# Patient Record
Sex: Male | Born: 1955
Health system: Southern US, Community
[De-identification: ages and names within clinical notes are randomized; demographics above are authoritative.]

## PROBLEM LIST (undated history)

## (undated) DIAGNOSIS — M719 Bursopathy, unspecified: Secondary | ICD-10-CM

## (undated) DIAGNOSIS — E039 Hypothyroidism, unspecified: Secondary | ICD-10-CM

## (undated) DIAGNOSIS — Z9989 Dependence on other enabling machines and devices: Secondary | ICD-10-CM

## (undated) DIAGNOSIS — N189 Chronic kidney disease, unspecified: Secondary | ICD-10-CM

## (undated) DIAGNOSIS — E669 Obesity, unspecified: Secondary | ICD-10-CM

## (undated) DIAGNOSIS — G4733 Obstructive sleep apnea (adult) (pediatric): Secondary | ICD-10-CM

## (undated) DIAGNOSIS — R972 Elevated prostate specific antigen [PSA]: Secondary | ICD-10-CM

## (undated) DIAGNOSIS — R7301 Impaired fasting glucose: Secondary | ICD-10-CM

## (undated) HISTORY — PX: ESOPHAGUS SURGERY: SHX626

## (undated) HISTORY — DX: Dependence on other enabling machines and devices: Z99.89

## (undated) HISTORY — DX: Obstructive sleep apnea (adult) (pediatric): G47.33

## (undated) HISTORY — DX: Chronic kidney disease, unspecified: N18.9

## (undated) HISTORY — DX: Obesity, unspecified: E66.9

## (undated) HISTORY — DX: Elevated prostate specific antigen (PSA): R97.20

## (undated) HISTORY — DX: Hypothyroidism, unspecified: E03.9

## (undated) HISTORY — DX: Impaired fasting glucose: R73.01

---

## 2007-06-01 ENCOUNTER — Ambulatory Visit: Payer: Self-pay | Admitting: Family Medicine

## 2007-06-01 DIAGNOSIS — E669 Obesity, unspecified: Secondary | ICD-10-CM | POA: Insufficient documentation

## 2007-06-01 DIAGNOSIS — E039 Hypothyroidism, unspecified: Secondary | ICD-10-CM | POA: Insufficient documentation

## 2007-06-01 DIAGNOSIS — N200 Calculus of kidney: Secondary | ICD-10-CM | POA: Insufficient documentation

## 2007-07-17 ENCOUNTER — Encounter: Payer: Self-pay | Admitting: Family Medicine

## 2007-07-17 LAB — CONVERTED CEMR LAB
ALT: 61 units/L — ABNORMAL HIGH (ref 0–53)
AST: 47 units/L — ABNORMAL HIGH (ref 0–37)
Albumin: 4.3 g/dL (ref 3.5–5.2)
Alkaline Phosphatase: 91 units/L (ref 39–117)
BUN: 14 mg/dL (ref 6–23)
CO2: 24 meq/L (ref 19–32)
Calcium: 9.9 mg/dL (ref 8.4–10.5)
Chloride: 107 meq/L (ref 96–112)
Cholesterol: 145 mg/dL (ref 0–200)
Creatinine, Ser: 0.95 mg/dL (ref 0.40–1.50)
Glucose, Bld: 125 mg/dL — ABNORMAL HIGH (ref 70–99)
HDL: 44 mg/dL (ref >39)
LDL Cholesterol: 61 mg/dL (ref 0–99)
Potassium: 4.4 meq/L (ref 3.5–5.3)
Sodium: 142 meq/L (ref 135–145)
Total Bilirubin: 0.5 mg/dL (ref 0.3–1.2)
Total CHOL/HDL Ratio: 3.3
Total Protein: 7.6 g/dL (ref 6.0–8.3)
Triglycerides: 200 mg/dL — ABNORMAL HIGH (ref <150)
VLDL: 40 mg/dL (ref 0–40)

## 2007-07-19 ENCOUNTER — Encounter: Payer: Self-pay | Admitting: Family Medicine

## 2008-02-05 ENCOUNTER — Ambulatory Visit: Payer: Self-pay | Admitting: Family Medicine

## 2008-02-05 LAB — CONVERTED CEMR LAB: Rapid Strep: NEGATIVE

## 2008-04-09 ENCOUNTER — Ambulatory Visit: Payer: Self-pay | Admitting: Family Medicine

## 2008-04-15 LAB — CONVERTED CEMR LAB: TSH: 3.965 microintl units/mL (ref 0.350–5.50)

## 2008-05-07 ENCOUNTER — Ambulatory Visit: Payer: Self-pay | Admitting: Family Medicine

## 2008-05-07 LAB — CONVERTED CEMR LAB: Blood Glucose, Fasting: 105 mg/dL

## 2008-06-04 ENCOUNTER — Ambulatory Visit: Payer: Self-pay | Admitting: Family Medicine

## 2008-10-22 ENCOUNTER — Ambulatory Visit: Payer: Self-pay | Admitting: Family Medicine

## 2008-10-22 LAB — CONVERTED CEMR LAB: Blood Glucose, Fasting: 107 mg/dL

## 2008-10-23 ENCOUNTER — Encounter: Payer: Self-pay | Admitting: Family Medicine

## 2008-10-23 DIAGNOSIS — R972 Elevated prostate specific antigen [PSA]: Secondary | ICD-10-CM

## 2008-10-23 HISTORY — DX: Elevated prostate specific antigen (PSA): R97.20

## 2008-10-23 LAB — CONVERTED CEMR LAB: TSH: 7.036 microintl units/mL — ABNORMAL HIGH (ref 0.350–4.50)

## 2009-01-23 ENCOUNTER — Encounter: Payer: Self-pay | Admitting: Family Medicine

## 2009-02-11 ENCOUNTER — Encounter: Payer: Self-pay | Admitting: Family Medicine

## 2009-03-10 ENCOUNTER — Encounter: Payer: Self-pay | Admitting: Family Medicine

## 2009-05-22 ENCOUNTER — Ambulatory Visit: Payer: Self-pay | Admitting: Family Medicine

## 2009-05-23 LAB — CONVERTED CEMR LAB: TSH: 4.209 microintl units/mL (ref 0.350–4.500)

## 2010-04-29 ENCOUNTER — Ambulatory Visit: Payer: Self-pay | Admitting: Family Medicine

## 2010-05-26 ENCOUNTER — Ambulatory Visit: Payer: Self-pay | Admitting: Family Medicine

## 2010-05-27 LAB — CONVERTED CEMR LAB
ALT: 50 units/L (ref 0–53)
AST: 37 units/L (ref 0–37)
Albumin: 4.5 g/dL (ref 3.5–5.2)
Alkaline Phosphatase: 83 units/L (ref 39–117)
BUN: 13 mg/dL (ref 6–23)
CO2: 19 meq/L (ref 19–32)
Calcium: 9.3 mg/dL (ref 8.4–10.5)
Chloride: 104 meq/L (ref 96–112)
Cholesterol: 144 mg/dL (ref 0–200)
Creatinine, Ser: 0.95 mg/dL (ref 0.40–1.50)
Glucose, Bld: 120 mg/dL — ABNORMAL HIGH (ref 70–99)
HDL: 48 mg/dL (ref >39)
LDL Cholesterol: 59 mg/dL (ref 0–99)
PSA: 4.73 ng/mL — ABNORMAL HIGH (ref 0.10–4.00)
Potassium: 4.2 meq/L (ref 3.5–5.3)
Sodium: 138 meq/L (ref 135–145)
TSH: 1.466 microintl units/mL (ref 0.350–4.500)
Total Bilirubin: 0.7 mg/dL (ref 0.3–1.2)
Total CHOL/HDL Ratio: 3
Total Protein: 7.8 g/dL (ref 6.0–8.3)
Triglycerides: 185 mg/dL — ABNORMAL HIGH (ref <150)
VLDL: 37 mg/dL (ref 0–40)

## 2010-07-14 ENCOUNTER — Encounter: Payer: Self-pay | Admitting: Family Medicine

## 2010-08-21 ENCOUNTER — Ambulatory Visit: Payer: Self-pay | Admitting: Family Medicine

## 2010-10-16 ENCOUNTER — Ambulatory Visit: Payer: Self-pay | Admitting: Family Medicine

## 2010-10-30 ENCOUNTER — Encounter: Admission: RE | Admit: 2010-10-30 | Discharge: 2010-10-30 | Payer: Self-pay | Admitting: Family Medicine

## 2010-10-30 ENCOUNTER — Ambulatory Visit: Payer: Self-pay | Admitting: Family Medicine

## 2010-10-30 DIAGNOSIS — M79609 Pain in unspecified limb: Secondary | ICD-10-CM | POA: Insufficient documentation

## 2010-10-30 LAB — CONVERTED CEMR LAB
Glucose, Urine, Semiquant: NEGATIVE
Ketones, urine, test strip: NEGATIVE
Nitrite: NEGATIVE
Protein, U semiquant: 100
Specific Gravity, Urine: 1.025
Urobilinogen, UA: 0.2
pH: 6

## 2010-10-31 ENCOUNTER — Encounter: Payer: Self-pay | Admitting: Family Medicine

## 2010-11-23 ENCOUNTER — Telehealth: Payer: Self-pay | Admitting: Family Medicine

## 2010-11-25 ENCOUNTER — Encounter
Admission: RE | Admit: 2010-11-25 | Discharge: 2010-12-17 | Payer: Self-pay | Source: Home / Self Care | Attending: Family Medicine | Admitting: Family Medicine

## 2010-11-27 ENCOUNTER — Ambulatory Visit: Payer: Self-pay | Admitting: Family Medicine

## 2010-11-27 ENCOUNTER — Telehealth (INDEPENDENT_AMBULATORY_CARE_PROVIDER_SITE_OTHER): Payer: Self-pay | Admitting: *Deleted

## 2010-12-01 ENCOUNTER — Encounter: Payer: Self-pay | Admitting: Family Medicine

## 2010-12-17 ENCOUNTER — Encounter: Payer: Self-pay | Admitting: Family Medicine

## 2011-01-14 ENCOUNTER — Encounter: Payer: Self-pay | Admitting: Family Medicine

## 2011-01-20 NOTE — Letter (Signed)
Summary: Physical Exam Form/[employer]  Physical Exam Form/[employer]   Imported By: Lanelle Bal 06/19/2010 09:24:32  _____________________________________________________________________  External Attachment:    Type:   Image     Comment:   External Document

## 2011-01-20 NOTE — Letter (Signed)
Summary: [facility]  [facility]   Imported By: Lanelle Bal 07/24/2010 11:30:03  _____________________________________________________________________  External Attachment:    Type:   Image     Comment:   External Document

## 2011-01-20 NOTE — Progress Notes (Signed)
Summary: CALL A NURSE  Phone Note From Other Clinic   Caller: CALL A NURSE Summary of Call: [facility] Triage Call Report Triage Record Num: [id number] Operator: Vernell Barrier Patient Name: Paul Reed Call Date & Time: 11/22/2010 10:20:01AM Patient Phone: [phone] PCP: [name], DO Patient Gender: Male PCP Fax : [phone] Patient DOB: May 02, 1956 Practice Name: [facility] Reason for Call: Pt calling regarding having increased pain with passing kidney stone. Afebrile. Pt seen in office x about 1 week ago and noted to have a kidney stone. Pt has hx of kidney stones. Pt states x 1 week ago when seen in office pain was not severe but 11/22/10 pain has increased. Pt states pain is manageable at this time but x 1 hour ago pt was unable to move off couch due to pain. Pain noted to be in side and lower back. Care advice given. Pt sent to UC per dispositon. Initial call taken by: Payton Spark CMA,  November 23, 2010 9:05 AM    Can you please find out if pt went to [facility] or if he needs me to call him in some meds?  [name], D.O.  Appended Document: CALL A NURSE Pt states he feels better today after taking advil and drinking plenty of water. Pt states he doesnt think he has passed the stone and would like pain med sent to pharm. Pt also would like you to know that calf pain is no better. Please advise.  Appended Document: CALL A NURSE RX for Percocet printed.  He can pick RX up here -- for severe pain. RX for Odansetron sent to pharmacy to use as needed nausea.  I will place referral to PT for partial calf tear. Have him f/u with me FRI for kidney stone.  [name], D.O.  Appended Document: CALL A NURSE Pt aware of the above

## 2011-01-20 NOTE — Assessment & Plan Note (Signed)
Summary: bronchitis   Vital Signs:  Patient Profile:   55 Years Old Male Height:     68 inches Weight:      226 pounds BMI:     34.49 O2 Sat:      95 % Temp:     102.2 degrees F oral Pulse rate:   97 / minute BP sitting:   109 / 61  (left arm) Cuff size:   large  Vitals Entered By: Harlene Salts (February 05, 2008 10:52 AM)                 Chief Complaint:  coughing, sorethroat, elevated temp, green drainage from nose, and yellow mucous from chest.  Acute Visit History:      The patient complains of cough, fever, headache, musculoskeletal symptoms, nausea, and sore throat.  These symptoms began 3 days ago.  He denies chest pain, diarrhea, earache, eye symptoms, and vomiting.  Other comments include: Taking Advil and Tylenol and nyquil.  Hurts to cough.  Had flu shot this year.  Marland Kitchen        His highest temperature has been 103.  This temperature was recorded last evening.        The character of the cough is described as nonproductive.  He has no history of COPD.        There is no history of recent exposure to strep associated with the sore throat.           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       Never smoked.       No exercise, poor diet       Denies ETOH       3 sodas a day    Review of Systems      See HPI   Physical Exam  General:     alert, well-developed, well-nourished, well-hydrated, and overweight-appearing.   Head:     normocephalic and atraumatic.  sinuses NTTP Eyes:     conjunctiva clear Ears:     EACs patent; TMs translucent and gray with good cone of light and bony landmarks.  Nose:     no external deformity and  no nasal discharge.   Mouth:     good dentition.  o/p injected, no exudates.  short thick neck Neck:     supple and no masses.   Chest Wall:     no tenderness.   Lungs:     decreased BS at the bases with dry cough; normal respiratory effort, no intercostal retractions, no accessory muscle use, no crackles, and no wheezes.   Heart:     tachy at 100 bpm, no murmur.   Skin:     color normal and no rashes.  skin warm and dry Cervical Nodes:     shotty tender anterior cervical LA    Impression & Recommendations:  Problem # 1:  ACUTE BRONCHITIS (ICD-466.0) Rapid strep Neg and had flu vaccine so unlikely to be influenza.  Due  to cough with chest tightness, decrease in pulse ox and constitutional symptoms with documented fevers, cover for bronchitis or atypical pneumonia with 10 days of Omnicef in addition to supportive care measures.  OK to take OTC Ibuprofen and Mucinex D.  Call if not improved in 10 days or feels worse before then.    His updated medication list for this problem includes:    Omnicef 300 Mg Caps (Cefdinir) .Marland Kitchen... 1 tab by mouth q 12 hrs x 10 days   Complete Medication List: 1)  Synthroid 50 Mcg Tabs (Levothyroxine sodium) .... Take 1 tablet by mouth once a day 2)  Omnicef 300 Mg Caps (Cefdinir) .Marland Kitchen.. 1 tab by mouth q 12 hrs x 10 days  Other Orders: Rapid Strep ([address])   Patient Instructions: 1)  Take 10 days of Omnicef to cover you for bronchitis. 2)  Stay on Ibuprofen for fevers, aches and pains. 3)  Plenty of clear fluids and rest. 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 5)  OK to use OTC Mucinex D for congestion. 6)  Call if not better by 10 days.    Prescriptions: OMNICEF 300 MG  CAPS (CEFDINIR) 1 tab by mouth q 12 hrs x 10 days  #20 x 0   Entered and Authorized by:   [name] DO   Signed by:   [name] DO on 02/05/2008   Method used:   Electronically sent to ...       [id number]       [address] [place], [place]  [address]        Ph: [phone]       Fax: [phone]   RxID:   [id number]  ] Laboratory Results  Date/Time Received: February 05, 2008 11:23 AM  Date/Time Reported: February 05, 2008 11:23 AM   Other Tests  Rapid Strep: negative  Kit Test Internal QC: Negative   (Normal Range: Negative)

## 2011-01-20 NOTE — Letter (Signed)
Summary: [facility]  [facility]   Imported By: Lanelle Bal 04/08/2009 14:21:07  _____________________________________________________________________  External Attachment:    Type:   Image     Comment:   External Document

## 2011-01-20 NOTE — Op Note (Signed)
Summary: Prostate Needle Biopsy/[facility]  Prostate Needle Biopsy/[facility]   Imported By: Lanelle Bal 03/18/2009 09:52:06  _____________________________________________________________________  External Attachment:    Type:   Image     Comment:   External Document

## 2011-01-20 NOTE — Assessment & Plan Note (Signed)
Summary: PHYSICAL   Vital Signs:  Patient profile:   55 year old male Height:      68 inches Weight:      231 pounds BMI:     35.25 O2 Sat:      96 % on Room air Pulse rate:   73 / minute BP sitting:   108 / 69  (left arm) Cuff size:   large  Vitals Entered By: Payton Spark CMA (May 26, 2010 8:24 AM)  O2 Flow:  Room air CC: CPE   Primary Care Provider:  [name] D.O.  CC:  CPE.  History of Present Illness: 55 yo WM presents for CPE.  He is an obese WM with hx of hypothryoidism.  Denies fam hx of prostate, colon cancer or of premature heart dz.  he is a non smoker who admits to a poor diet and lack of exercise.  He is due for fasting labs today including TSH.  He had a prostate biopsy in March 2010 with DR Nesi that came back normal following an elevated PSA lst year. He has not followed back up with Dr Brunilda Payor.  He is feeling well.  Denies CP, DOE.  He is using his CPAP.  He is overdue for his initial screening colonoscopy.   Current Medications (verified): 1)  Levothyroxine Sodium 100 Mcg Tabs (Levothyroxine Sodium) .Marland Kitchen.. 1 Tab By Mouth Daily As Directed  Allergies (verified): No Known Drug Allergies  Past History:  Past Medical History: Reviewed history from 05/07/2008 and no changes required. hypothyroidism obesity OSA on CPAP x 13 yrs impaired fasting glucose  Family History: Reviewed history from 06/01/2007 and no changes required. father died of lung cancer at 63 mother alive, thyroid d/o, SCC 3 brothers, 1 sister alive and well  Social History: Reviewed history from 06/01/2007 and no changes required. 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 Never smoked. No exercise, poor diet Denies ETOH 3 sodas a day  Review of Systems  The patient denies anorexia, fever, weight loss, weight gain, vision loss, decreased hearing, hoarseness, chest pain, syncope, dyspnea on exertion, peripheral edema, prolonged cough,  headaches, hemoptysis, abdominal pain, melena, hematochezia, severe indigestion/heartburn, hematuria, incontinence, genital sores, muscle weakness, suspicious skin lesions, transient blindness, difficulty walking, depression, unusual weight change, abnormal bleeding, enlarged lymph nodes, angioedema, breast masses, and testicular masses.    Physical Exam  General:  obese WM in NAD Head:  normocephalic and atraumatic.   Eyes:  PERRLA; wears glasses Ears:  EACs patent; TMs translucent and gray with good cone of light and bony landmarks.  Nose:  no nasal discharge.   Mouth:  good dentition and pharynx pink and moist.   Neck:  no masses.   Lungs:  Normal respiratory effort, chest expands symmetrically. Lungs are clear to auscultation, no crackles or wheezes. Heart:  Normal rate and regular rhythm. S1 and S2 normal without gallop, murmur, click, rub or other extra sounds. Abdomen:  Bowel sounds positive,abdomen soft and non-tender without masses, organomegaly, no AA bruits Rectal:  No external abnormalities noted. Normal sphincter tone. No rectal masses or tenderness. hemoccult neg Prostate:  Prostate gland firm and smooth, 1+ enlargement, nodularity, tenderness, mass, asymmetry or induration. Pulses:  2+ pedal pulses Extremities:  no LE edema pes planus Skin:  color normal and no suspicious lesions.   Cervical Nodes:  No lymphadenopathy noted Psych:  good eye contact, not anxious appearing, and flat affect.  Impression & Recommendations:  Problem # 1:  HEALTH SCREENING (ICD-V70.0) Keeping healthy checklist for men reviewed. BP at goal.  BMI 35 c/w class II obesity. Update fasting labs. Tetanus due 2019. Overdue for initial screening colonoscopy, will update at [facility]. MVI daily. Work on diet, exercise, wt loss. DRE + PSA today for prostate cancer screening.  Complete Medication List: 1)  Levothyroxine Sodium 100 Mcg Tabs (Levothyroxine sodium) .Marland Kitchen.. 1 tab by  mouth daily as directed  Other Orders: T-PSA ([id number]) T-TSH [phone]) T-Comprehensive Metabolic Panel [phone]) T-Lipid Profile [id number]) Gastroenterology Referral (GI)

## 2011-01-20 NOTE — Assessment & Plan Note (Signed)
Summary: f/u thyroid   Vital Signs:  Patient profile:   55 year old male Height:      68 inches Weight:      232 pounds BMI:     35.40 O2 Sat:      97 % on Room air Pulse rate:   76 / minute BP sitting:   113 / 75  (left arm) Cuff size:   large  Vitals Entered By: Payton Spark CMA (Apr 29, 2010 1:21 PM)  O2 Flow:  Room air CC: F/U thyroid. Has been out of medication for about 1 month.    Primary Care Provider:  [name] D.O.  CC:  F/U thyroid. Has been out of medication for about 1 month. .  History of Present Illness: 55 yo WM presents for f/u hypothyroidism.  He ran out of his thyroid medicine a couple mos ago and he is now feeling very tired.  he denies wt gain, cold intolerance, dry skin, edema  constipation.  He is due for fasting labs.    He also has a cyst over the DIP joint on the R index finger, non painful x 2 mos.    Allergies: No Known Drug Allergies  Past History:  Past Medical History: Reviewed history from 05/07/2008 and no changes required. hypothyroidism obesity OSA on CPAP x 13 yrs impaired fasting glucose  Social History: Reviewed history from 06/01/2007 and no changes required. 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 Never smoked. No exercise, poor diet Denies ETOH 3 sodas a day  Review of Systems      See HPI  Physical Exam  General:  alert, well-developed, well-nourished, and well-hydrated.  obese Head:  normocephalic and atraumatic.   Mouth:  pharynx pink and moist.   Neck:  thyromegaly. Lungs:  Normal respiratory effort, chest expands symmetrically. Lungs are clear to auscultation, no crackles or wheezes. Heart:  Normal rate and regular rhythm. S1 and S2 normal without gallop, murmur, click, rub or other extra sounds. Extremities:  no pretibial edema Skin:  color normal.   small synovial cyst (superficial) over the R 2nd digit DIP joint finger Cervical Nodes:  No lymphadenopathy  noted Psych:  good eye contact and flat affect.     Impression & Recommendations:  Problem # 1:  HYPOTHYROIDISM (ICD-244.9) Pt poorly compliant with taking his thyroid medicine over the last 2-3 mos causing fatigue.   We discussed the importance of taking his medicine daily and RFd at previous dose today. Recheck TSH with fasting labs at his CPE next month. His updated medication list for this problem includes:    Levothyroxine Sodium 100 Mcg Tabs (Levothyroxine sodium) .Marland Kitchen... 1 tab by mouth daily as directed  Problem # 2:  SYNOVIAL CYST (ICD-727.40) cleaned with alcohol and opened with a TB syringe, releasing clear gelatinous fluid.  Bandaid applied.    Complete Medication List: 1)  Levothyroxine Sodium 100 Mcg Tabs (Levothyroxine sodium) .Marland Kitchen.. 1 tab by mouth daily as directed  Patient Instructions: 1)  Restart thyroid medicine everyday. 2)  Will get fasting labs at your physical next month. Prescriptions: LEVOTHYROXINE SODIUM 100 MCG TABS (LEVOTHYROXINE SODIUM) 1 tab by mouth daily as directed  #30 x 1   Entered and Authorized by:   [name] DO   Signed by:   [name] DO on 04/29/2010   Method used:   Electronically to        [facility]  [id number]* (retail)       [address] [place], [place]  [address]       Ph: [phone]       Fax: [phone]   RxID:   [id number]

## 2011-01-20 NOTE — Assessment & Plan Note (Signed)
Summary: Calf pain   Vital Signs:  Patient profile:   55 year old male Height:      68 inches Weight:      235 pounds Pulse rate:   74 / minute BP sitting:   126 / 83  (right arm) Cuff size:   large  Vitals Entered By: Avon Gully CMA, Duncan Dull) (October 16, 2010 1:05 PM) CC: left leg pain hurt it palying footbal last pm   Primary Care Katlin Bortner:  [name] D.O.  CC:  left leg pain hurt it palying footbal last pm.  History of Present Illness: left leg pain hurt it palying footbal last pm. Thinks didn't stretch out well.  Was funning and felt a pull in the left calf.  Iced it initialy and took some ASA. Then put heat on it.  Painful to walk on it.  No swelling or bruising. Better at rest.   Current Medications (verified): 1)  Levothyroxine Sodium 100 Mcg Tabs (Levothyroxine Sodium) .Marland Kitchen.. 1 Tab By Mouth Daily As Directed 2)  Antivert 50 Mg Tabs (Meclizine Hcl) .Marland Kitchen.. 1 Tab By Mouth Two Times A Day As Needed Vertigo  Allergies (verified): No Known Drug Allergies  Comments:  Nurse/Medical Assistant: The patient's medications and allergies were reviewed with the patient and were updated in the Medication and Allergy Lists. Avon Gully CMA, Duncan Dull) (October 16, 2010 1:06 PM)  Physical Exam  General:  Well-developed,well-nourished,in no acute distress; alert,appropriate and cooperative throughout examination Msk:  Left hip, knee and ankle with NROM and strength 5/5.  Normal squeeze test.  TEnder with sqeeze. NO bruising but does have some varicose veins.Trace swelling over the calf.    Impression & Recommendations:  Problem # 1:  MUSCLE STRAIN, LEFT CALF (ICD-844.8)  Leg muscle is strained. Possible partial tear. RICE therapy. ACE wrap for compression Gentle stretches.  F/U in 2-3 weeks if not better.   Orders: Ace Wraps 3-5 in/yard  (A5409)  Complete Medication List: 1)  Levothyroxine Sodium 100 Mcg Tabs (Levothyroxine sodium) .Marland Kitchen.. 1 tab by mouth daily as  directed 2)  Antivert 50 Mg Tabs (Meclizine hcl) .Marland Kitchen.. 1 tab by mouth two times a day as needed vertigo  Patient Instructions: 1)  Ice, elevated and use the ACE wrap 2)  Ibuprofen 600mg  three times a day with foods and wean down as you are feeling better.  3)  Gentle stretches.  4)  If nto better in 2-3 weeks to return to office.    Orders Added: 1)  Est. Patient Level III [81191] 2)  Ace Wraps 3-5 in/yard  [Y7829]   Immunization History:  Influenza Immunization History:    Influenza:  historical (10/12/2010)   Immunization History:  Influenza Immunization History:    Influenza:  Historical (10/12/2010)   Immunization History:  Influenza Immunization History:    Influenza:  historical (10/12/2010)

## 2011-01-20 NOTE — Assessment & Plan Note (Signed)
Summary: vertigo   Vital Signs:  Patient profile:   55 year old male Height:      68 inches Weight:      229 pounds BMI:     34.95 O2 Sat:      97 % on Room air Pulse rate:   63 / minute Pulse (ortho):   71 / minute BP sitting:   125 / 86  (right arm) BP standing:   117 / 79 Cuff size:   large  Vitals Entered By: Payton Spark CMA (August 21, 2010 11:02 AM)  O2 Flow:  Room air  Serial Vital Signs/Assessments:  Time      Position  BP       Pulse  Resp  Temp     By 11:03 AM  Lying RA  125/85   63                    [facility] CMA 11:03 AM  Sitting   125/86   66                    Payton Spark CMA 11:03 AM  Standing  117/79   71                    Payton Spark CMA  CC: Dizzy  Vision Screening:       Vision Entered By: Payton Spark CMA (August 21, 2010 11:02 AM)   Primary Care Joshuajames Moehring:  [name] D.O.  CC:  Dizzy.  History of Present Illness: 55 yo WM presents for vertigo that started at 0100.  He woke up to to go the restroom and has had spinning to the L side.  He has never had this before.  He has nausea and vomitting from the dizziness.    Denies tinnitus, ear pain or hearing loss.  Slight head congestion.  Denies HA or vision changes.    Allergies: No Known Drug Allergies  Past History:  Past Medical History: Reviewed history from 05/07/2008 and no changes required. hypothyroidism obesity OSA on CPAP x 13 yrs impaired fasting glucose  Social History: Reviewed history from 06/01/2007 and no changes required. 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 Never smoked. No exercise, poor diet Denies ETOH 3 sodas a day  Review of Systems      See HPI  Physical Exam  General:  alert, well-developed, well-nourished, and well-hydrated.  obese. here with wife Head:  normocephalic and atraumatic.   Eyes:  PERRLA, EOMI Ears:  EACs patent; TMs translucent and gray with good cone of light and  bony landmarks.  Nose:  no nasal discharge.   Mouth:  pharynx pink and moist.   Neck:  no masses.   Lungs:  Normal respiratory effort, chest expands symmetrically. Lungs are clear to auscultation, no crackles or wheezes. Heart:  Normal rate and regular rhythm. S1 and S2 normal without gallop, murmur, click, rub or other extra sounds. Neurologic:  pt unable to do [facility] due to N/V. no tremor. Holding head steady Skin:  color normal.   Cervical Nodes:  No lymphadenopathy noted Psych:  good eye contact, not anxious appearing, and not depressed appearing.     Impression & Recommendations:  Problem # 1:  BENIGN PAROXYSMAL POSITIONAL VERTIGO (ICD-386.11)  Classic BPPV. Treated acute symptoms with Phenergan 25 mg IM x 1 today.  H/O given to pt/ wife on  BPPV and doing Epley Manuever at home. Antivert RX given.  Expect improvement in 3-4 days with use of maneuvers and meds.  His updated medication list for this problem includes:    Antivert 50 Mg Tabs (Meclizine hcl) .Marland Kitchen... 1 tab by mouth two times a day as needed vertigo  Orders: Promethazine up to 50mg  (J2550) Admin of Therapeutic Inj  intramuscular or subcutaneous ([address])  Complete Medication List: 1)  Levothyroxine Sodium 100 Mcg Tabs (Levothyroxine sodium) .Marland Kitchen.. 1 tab by mouth daily as directed 2)  Antivert 50 Mg Tabs (Meclizine hcl) .Marland Kitchen.. 1 tab by mouth two times a day as needed vertigo  Patient Instructions: 1)  Phenergan injection now for dizziness/ nausea.  It will make you sleepy for 4-6 hrs. 2)  Try to do Epley Maneuvers at home after given Phenergan. 3)  RX for Meclizine given for as needed continued vertigo symptoms. 4)  Call if not improving by Tuesday. Prescriptions: ANTIVERT 50 MG TABS (MECLIZINE HCL) 1 tab by mouth two times a day as needed vertigo  #14 x 0   Entered and Authorized by:   [name] DO   Signed by:   [name] DO on 08/21/2010   Method used:   Electronically to        [facility]  [phone]* (retail)       [address] [place], [place]  [address]       Ph: [phone]       Fax: [phone]   RxID:   [id number]    Medication Administration  Injection # 1:    Medication: Promethazine up to 50mg     Diagnosis: BENIGN PAROXYSMAL POSITIONAL VERTIGO (ICD-386.11)    Route: IM    Site: LUOQ gluteus    Exp Date: 11/2011    Lot #: [id number]    Comments: 25mg     Patient tolerated injection without complications    Given by: Payton Spark CMA (August 21, 2010 1:36 PM)  Orders Added: 1)  Est. Patient Level III [34742] 2)  Promethazine up to 50mg  [J2550] 3)  Admin of Therapeutic Inj  intramuscular or subcutaneous [59563]

## 2011-01-20 NOTE — Assessment & Plan Note (Signed)
Summary: f/u kidney stone   Vital Signs:  Patient profile:   55 year old male Height:      68 inches Weight:      232 pounds BMI:     35.40 O2 Sat:      98 % on Room air Temp:     97.9 degrees F oral Pulse rate:   74 / minute BP sitting:   118 / 69  (left arm) Cuff size:   large  Vitals Entered By: Payton Spark CMA (November 27, 2010 2:20 PM)  O2 Flow:  Room air CC: F/U kidney stones.    Primary Care Provider:  [name] D.O.  CC:  F/U kidney stones. .  History of Present Illness: 55 yo WM presents for f/u gross hematuria from 1 month ago.  His UA and culture came back normal other then the presence of large  blood.  A KUB was done which revealed an L renal calculus.  Since then, he has had some renal colic which appears to be improving.  His flank pain has now turned into suprapbuic pressure but no increased urinary frequency, dysuria, urgency or hematuria.  He is voiding nromally and drinking more water.  Denies fever, chills, N/V.  Has seen Dr Brunilda Payor in the past for kidney stones.  He has RX for Percocet and Zofran at home which he has not used yet.       Current Medications (verified): 1)  Levothyroxine Sodium 100 Mcg Tabs (Levothyroxine Sodium) .Marland Kitchen.. 1 Tab By Mouth Daily As Directed 2)  Antivert 50 Mg Tabs (Meclizine Hcl) .Marland Kitchen.. 1 Tab By Mouth Two Times A Day As Needed Vertigo 3)  Percocet 5-325 Mg Tabs (Oxycodone-Acetaminophen) .Marland Kitchen.. 1-2 Tabs By Mouth Three Times A Day As Needed Severe Pain; Take With Food 4)  Ondansetron Hcl 8 Mg Tabs (Ondansetron Hcl) .Marland Kitchen.. 1 Tab By Mouth Three Times A Day As Needed Nausea  Allergies (verified): No Known Drug Allergies  Past History:  Past Medical History: Reviewed history from 10/30/2010 and no changes required. hypothyroidism obesity OSA on CPAP x 13 yrs impaired fasting glucose kidney stones  Past Surgical History: Reviewed history from 06/01/2007 and no changes required. 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 Never smoked. No exercise, poor diet Denies ETOH 3 sodas a day  Review of Systems      See HPI  Physical Exam  General:  alert, well-developed, well-nourished, well-hydrated, and overweight-appearing.   Head:  normocephalic and atraumatic.   Eyes:  sclera non icteric Mouth:  pharynx pink and moist.   Neck:  no masses.   Lungs:  Normal respiratory effort, chest expands symmetrically. Lungs are clear to auscultation, no crackles or wheezes. Heart:  Normal rate and regular rhythm. S1 and S2 normal without gallop, murmur, click, rub or other extra sounds. Abdomen:  slight suprapubic TTP with vol guarding, no CVAT.  abd soft, NT/ ND.  NABS Extremities:  L>R ankle edema ( partial calf tear - in PT) Skin:  color normal.   Psych:  good eye contact, not anxious appearing, and not depressed appearing.     Impression & Recommendations:  Problem # 1:  NEPHROLITHIASIS (ICD-592.0) Gross hematuria has improved.  Urine cx was NEG.  KUB did confirm L renal calculus last month.  No longer having renal colic but has suprapubic  tenderness.  Has RX for Percocet and Zofran if needed for discomfort.  I will set him up with Dr Brunilda Payor to follow his stone.  He may need a CT Urogram.    Complete Medication List: 1)  Levothyroxine Sodium 100 Mcg Tabs (Levothyroxine sodium) .Marland Kitchen.. 1 tab by mouth daily as directed 2)  Antivert 50 Mg Tabs (Meclizine hcl) .Marland Kitchen.. 1 tab by mouth two times a day as needed vertigo 3)  Percocet 5-325 Mg Tabs (Oxycodone-acetaminophen) .Marland Kitchen.. 1-2 tabs by mouth three times a day as needed severe pain; take with food 4)  Ondansetron Hcl 8 Mg Tabs (Ondansetron hcl) .Marland Kitchen.. 1 tab by mouth three times a day as needed nausea  Patient Instructions: 1)  Will get you in with Dr Brunilda Payor next wk for f/u kidney stone. 2)  You have the pain/ nausea meds to use  as needed. 3)  Thyroid medicine RFd. Prescriptions: LEVOTHYROXINE SODIUM 100 MCG TABS (LEVOTHYROXINE SODIUM) 1 tab by mouth daily as directed  #30 Tablet x 3   Entered and Authorized by:   [name] DO   Signed by:   [name] DO on 11/27/2010   Method used:   Electronically to        [facility] [phone]* (retail)       [address] [place], [place]  [address]       Ph: [phone]       Fax: [phone]   RxID:   [id number]    Orders Added: 1)  Est. Patient Level III [24401]

## 2011-01-20 NOTE — Letter (Signed)
Summary: Lipid Letter  [facility] [address], [address]   [place], [place] [address]   Phone: [phone]  Fax: [phone]    10/23/2008  Randy Castrejon [address] [place], [place]  [address]  Dear Hessie Diener:  We have carefully reviewed your last lipid profile from 10/22/2008 and the results are noted below with a summary of recommendations for lipid management.    Cholesterol:       148     Goal: < 200   HDL "good" Cholesterol:   44     Goal: > 40   LDL "bad" Cholesterol:   58     Goal: < 130   Triglycerides:       230     Goal: < 150    Cholesterol at goal except for elevated triglycerides.  Work on [employer] and exercise.  Add Omega 3 Fish Oil 2 grams daily to your routine. Chemistry panel shows normal kidney and liver function.    TLC Diet (Therapeutic Lifestyle Change): Saturated Fats & Transfatty acids should be kept < 7% of total calories ***Reduce Saturated Fats Polyunstaurated Fat can be up to 10% of total calories Monounsaturated Fat Fat can be up to 20% of total calories Total Fat should be no greater than 25-35% of total calories Carbohydrates should be 50-60% of total calories Protein should be approximately 15% of total calories Fiber should be at least 20-30 grams a day ***Increased fiber may help lower LDL Total Cholesterol should be < 200mg /day Consider adding plant stanol/sterols to diet (example: Benacol spread) ***A higher intake of unsaturated fat may reduce Triglycerides and Increase HDL    Adjunctive Measures (may lower LIPIDS and reduce risk of Heart Attack) include: Aerobic Exercise (20-30 minutes 3-4 times a week) Limit Alcohol Consumption Weight Reduction Folic Acid 1mg  a day by mouth Dietary Fiber 20-30 grams a day by mouth     Current Medications: 1)    Levothroid 88 Mcg Tabs (Levothyroxine sodium) .Marland Kitchen.. 1 tab by mouth daily  If you have any questions, please call. We appreciate being able to work with you.     Sincerely,    [facility] Bowen DO

## 2011-01-20 NOTE — Letter (Signed)
Summary: PHYSICAL FORM  PHYSICAL FORM   Imported By: Harlene Salts 07/31/2008 09:08:39  _____________________________________________________________________  External Attachment:    Type:   Image     Comment:   External Document

## 2011-01-20 NOTE — Assessment & Plan Note (Signed)
Summary: NOV- obesity/thyroid/kidney stones   Vital Signs:  Patient Profile:   55 Years Old Male Height:     68 inches Weight:      229 pounds BMI:     34.95 Pulse rate:   67 / minute BP sitting:   122 / 77  (left arm) Cuff size:   large  Vitals Entered By: Harlene Salts (June 01, 2007 3:42 PM)               Visit Type:  NOV PCP:  [name] D.O.  Chief Complaint:  NOV.  History of Present Illness: 55 yo WM presents as a new pt to establish care.  Has hx of hypothyroidism x 3 yrs, followed at [facility].  Hx of kidney stones, not requiring intervention.  Would like to lose wt.  Not exercising and not eating healthy.  Last CPE 3 yrs ago.  Feels well.  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    Never smoked.    No exercise, poor diet    Denies ETOH    3 sodas a day   Risk Factors:  Tobacco use:  never HIV high-risk behavior:  no Caffeine use:  3 drinks per day Alcohol use:  no Exercise:  no  Family History Risk Factors:    Family History of MI in females < 46 years old:  no    Family History of MI in males < 29 years old:  no   Review of Systems       no fevers/sweats/weakness, unexplained wt loss/gain, no change in vision, no difficulty hearing, ringing in ears, no hay fever/allergies, no CP/discomfort, no palpitations, no breast lump/nipple discharge, no cough/wheeze, no blood in stool, N/V/D, no nocturia, no leaking urine, no unusual vag bleeding, no vaginal/penile discharge, no muscle/joint pain, no rash, no new/changing mole, no HA, no memory loss, no anxiety, no sleep  problem, no depression, no unexplained lumps, no easy bruising/bleeding, no concern with sexual dysfunction    Physical Exam  General:     alert, well-developed, well-nourished, well-hydrated, and overweight-appearing.  alert, well-developed, well-nourished, well-hydrated, and overweight-appearing.   Head:     normocephalic and atraumatic.  normocephalic and atraumatic.   Mouth:     pharynx pink and moist and fair dentition.  pharynx pink and moist and fair dentition.   Neck:     no masses.  no masses.   Lungs:     Normal respiratory effort, chest expands symmetrically. Lungs are clear to auscultation, no crackles or wheezes. Heart:     Normal rate and regular rhythm. S1 and S2 normal without gallop, murmur, click, rub or other extra sounds. Skin:     color normal.  color normal.      Impression & Recommendations:  Problem # 1:  HYPOTHYROIDISM (ICD-244.9) Continue Synthroid.  REcently had normal TSH.  Recheck 6 mos. His updated medication list for this problem includes:    Synthroid 50 Mcg Tabs (Levothyroxine sodium) .Marland KitchenMarland KitchenMarland KitchenMarland Kitchen  Take 1 tablet by mouth once a day   Problem # 2:  NEPHROLITHIASIS (ICD-592.0) Stable and asymptomatic currently.  Discussed increased water intake.  Return during acute attack.  Check renal function.  Problem # 3:  OBESITY NOS (ICD-278.00) Counseled on healthy diet and regular exercise.  Get CMP and FLP.  F/U for CPE in 3 mos.  Medications Added to Medication List This Visit: 1)  Synthroid 50 Mcg Tabs (Levothyroxine sodium) .... Take 1 tablet by mouth once a day  Other Orders: T-Lipid Profile ([id number]) T-Comprehensive Metabolic Panel ([id number])   Patient Instructions: 1)  have fasting labs drawn. 2)  I will send your results in the mail. 3)  Stay on Synthroid. 4)  Work on [employer] and exercise. 5)  Return for a CPE in 3 months.

## 2011-01-20 NOTE — Letter (Signed)
Summary: Lipid Letter  [facility] [address]   [place], [place] [address]   Phone: [phone]  Fax: [phone]    10/23/2008  Mansour Balboa [address] [place], [place]  [address]  Dear Hessie Diener:  We have carefully reviewed your last lipid profile from 10/22/2008 and the results are noted below with a summary of recommendations for lipid management.    Cholesterol:       148     Goal: < 200   HDL "good" Cholesterol:   44     Goal: > 40   LDL "bad" Cholesterol:   58     Goal: < 130   Triglycerides:       230     Goal: < 150    Cholesterol at goal except for elevated TG's.  Work on [employer] and exercise.  Recheck in 1 yr.  Normal kidney and liver function.    TLC Diet (Therapeutic Lifestyle Change): Saturated Fats & Transfatty acids should be kept < 7% of total calories ***Reduce Saturated Fats Polyunstaurated Fat can be up to 10% of total calories Monounsaturated Fat Fat can be up to 20% of total calories Total Fat should be no greater than 25-35% of total calories Carbohydrates should be 50-60% of total calories Protein should be approximately 15% of total calories Fiber should be at least 20-30 grams a day ***Increased fiber may help lower LDL Total Cholesterol should be < 200mg /day Consider adding plant stanol/sterols to diet (example: Benacol spread) ***A higher intake of unsaturated fat may reduce Triglycerides and Increase HDL    Adjunctive Measures (may lower LIPIDS and reduce risk of Heart Attack) include: Aerobic Exercise (20-30 minutes 3-4 times a week) Limit Alcohol Consumption Weight Reduction Folic Acid 1mg  a day by mouth Dietary Fiber 20-30 grams a day by mouth     Current Medications: 1)    Levothroid 88 Mcg Tabs (Levothyroxine sodium) .Marland Kitchen.. 1 tab by mouth daily  If you have any questions, please call. We appreciate being able to work with you.   Sincerely,    [facility]  Bowen DO

## 2011-01-20 NOTE — Assessment & Plan Note (Signed)
Summary: f/u TSH   Vital Signs:  Patient Profile:   55 Years Old Male Height:     68 inches Weight:      233 pounds BMI:     35.56 Pulse rate:   79 / minute BP sitting:   119 / 71  (left arm) Cuff size:   large  Vitals Entered By: Kathlene November (April 09, 2008 2:48 PM)                 Visit Type:  f/u PCP:  [name] D.O.  Chief Complaint:  needs thyroid checked.Marland Kitchen  History of Present Illness: 55 yo WM seen back for TSH.  Has been on Levothyroxine 50 micrograms daily for several years.  Denies feeling fatigued but has had some weight gain.  Denies skin changes or constipation.    Andriy never followed up for his labs last summer including an elevated glucose in the pre-diabetic range.  he has failed to lose weight, work on diet or start exercising.  he is due to have it rechecked.      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       Never smoked.       No exercise, poor diet       Denies ETOH       3 sodas a day    Review of Systems      See HPI   Physical Exam  General:     alert, well-developed, well-nourished, and well-hydrated.  obese Head:     normocephalic, atraumatic, and no alopecia.   Eyes:     pupils equal, pupils round, and pupils reactive to light.   Mouth:     pharynx pink and moist.   Neck:     supple and no masses.   Lungs:     Normal respiratory effort, chest expands symmetrically. Lungs are clear to auscultation, no crackles or wheezes. Heart:     Normal rate and regular rhythm. S1 and S2 normal without gallop, murmur, click, rub or other extra sounds. Extremities:     No clubbing, cyanosis, edema, or deformity noted   Skin:     color normal.   Psych:     good eye contact, not anxious appearing, and flat affect.      Impression & Recommendations:  Problem # 1:  HYPOTHYROIDISM (ICD-244.9) Recheck TSH and adjust meds as needed.  Noted that he does take generic levothyroxine.   His updated medication list for this problem includes:    Levothroid 50 Mcg Tabs (Levothyroxine sodium) .Marland Kitchen... 1 tab by mouth daily  Orders: T-TSH ([id number])  Will have him RTC in 4 wks for recheck fasting glucose with Office visit.  Will likley need nutrtion referral.  Info given to pt from [facility] [email or web address] on pre- DM.   Complete Medication List: 1)  Levothroid 50 Mcg Tabs (Levothyroxine sodium) .Marland Kitchen.. 1 tab by mouth daily   Patient Instructions: 1)  F/U in 4 wks for office visit to repeat fasting  glucose.    ]

## 2011-01-20 NOTE — Consult Note (Signed)
Summary: [facility]  [facility]   Imported By: Esmeralda Links D'jimraou 02/05/2009 14:48:33  _____________________________________________________________________  External Attachment:    Type:   Image     Comment:   External Document

## 2011-01-20 NOTE — Assessment & Plan Note (Signed)
Summary: partial calf tear/ hematuria   Vital Signs:  Patient profile:   55 year old male Height:      68 inches Weight:      232 pounds BMI:     35.40 O2 Sat:      97 % on Room air Pulse rate:   85 / minute BP sitting:   117 / 80  (left arm) Cuff size:   large  Vitals Entered By: Payton Spark CMA (October 30, 2010 9:46 AM)  O2 Flow:  Room air CC: L leg/ankle pain and swelling x 2 weeks. Also c/o blood in urine x this AM   Primary Care Lucille Witts:  [name] D.O.  CC:  L leg/ankle pain and swelling x 2 weeks. Also c/o blood in urine x this AM.  History of Present Illness: 55 yo WM presents for L calf pain f/u, seen by Dr Linford Arnold a wk ago.  This started 2 wks ago.  He has been wrapping it and it is getting better.  Less pain and swelling.  Able to walk OK.  He has noticed gross hematuria today.  He has had some abdominal discomfort for a few days.  Has hx of kidney stones.  Denies flank pain.  It has been a few years since his last kidney stone.  denies dysuria, increased frequency, fevers or chills.  Denies N/V.     Current Medications (verified): 1)  Levothyroxine Sodium 100 Mcg Tabs (Levothyroxine Sodium) .Marland Kitchen.. 1 Tab By Mouth Daily As Directed 2)  Antivert 50 Mg Tabs (Meclizine Hcl) .Marland Kitchen.. 1 Tab By Mouth Two Times A Day As Needed Vertigo  Allergies (verified): No Known Drug Allergies  Past History:  Past Medical History: hypothyroidism obesity OSA on CPAP x 13 yrs impaired fasting glucose kidney stones  Social History: Reviewed history from 06/01/2007 and no changes required. 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 Never smoked. No exercise, poor diet Denies ETOH 3 sodas a day  Review of Systems      See HPI  Physical Exam  General:  alert, well-developed, well-nourished, and well-hydrated.  obese Head:  normocephalic and atraumatic.   Eyes:  sclera non icteric Mouth:  good dentition and pharynx pink and  moist.   Neck:  no masses.  thick neck circumf. Lungs:  Normal respiratory effort, chest expands symmetrically. Lungs are clear to auscultation, no crackles or wheezes. Heart:  Normal rate and regular rhythm. S1 and S2 normal without gallop, murmur, click, rub or other extra sounds. Abdomen:  soft.  moderately distended with typmanic BS.  no CVAT.  no guarding. Msk:  slight edema over the L>R ankle without palpable tenderness or muscle buldge.  able to raise on toes and bear weight on heels w/o problem.  gait is normal.   Neurologic:  gait normal.   Skin:  color normal.  no pallor or diaphoresis Cervical Nodes:  No lymphadenopathy noted Psych:  good eye contact, not anxious appearing, and not depressed appearing.     Impression & Recommendations:  Problem # 1:  GROSS HEMATURIA (ICD-599.71) KUB today confirms a L renal calculus that is not trying to pass yet.  He really is not having symptoms of renal colic so his hematuria may just be from presence of stone in the kidney.  Ucx sent --> treat only if positive, o/w woiuld wait for symptoms of renal colic prior to treatment of this fairly small sized stone. Orders: T-Culture,  Urine ([id number]) T-DG ABD 1 View (74000)  Problem # 2:  CALF PAIN, LEFT (ICD-729.5) Partial gastroc tear based on exam findings with some improvmenets after 2 wks.  He is to continue to ace wrap and use NSAIDs for now.  Avoid sprinting x 6 wks. Call if not improved in 4-6 wks.  next step would be PT or sports med referral to u/s the gastroc.    Complete Medication List: 1)  Levothyroxine Sodium 100 Mcg Tabs (Levothyroxine sodium) .Marland Kitchen.. 1 tab by mouth daily as directed 2)  Antivert 50 Mg Tabs (Meclizine hcl) .Marland Kitchen.. 1 tab by mouth two times a day as needed vertigo  Other Orders: UA Dipstick w/o Micro (automated)  (81003)  Patient Instructions: 1)  Xray abdomen today to look for kidney stones. 2)  I will call you w/ results this afternoon. 3)  Will culture urine  which will be back on Monday. 4)  Wrap calf for partial calf tear.  5)  Avoid sprints. 6)  Use Advil for comfort. 7)  Call if not improved after 4- 6 wks.   Orders Added: 1)  UA Dipstick w/o Micro (automated)  [81003] 2)  T-Culture, Urine [[id number]] 3)  T-DG ABD 1 View [74000] 4)  Est. Patient Level IV [03474]    Laboratory Results   Urine Tests    Routine Urinalysis   Color: brown Appearance: Hazy Glucose: negative   (Normal Range: Negative) Bilirubin: small   (Normal Range: Negative) Ketone: negative   (Normal Range: Negative) Spec. Gravity: 1.025   (Normal Range: 1.003-1.035) Blood: large   (Normal Range: Negative) pH: 6.0   (Normal Range: 5.0-8.0) Protein: 100   (Normal Range: Negative) Urobilinogen: 0.2   (Normal Range: 0-1) Nitrite: negative   (Normal Range: Negative) Leukocyte Esterace: trace   (Normal Range: Negative)       Appended Document: partial calf tear/ hematuria

## 2011-01-20 NOTE — Assessment & Plan Note (Signed)
Summary: CPE   Vital Signs:  Patient Profile:   55 Years Old Male Height:     68 inches Weight:      226 pounds BMI:     34.49 Pulse rate:   69 / minute BP sitting:   105 / 63  (left arm) Cuff size:   large  Vitals Entered By: Harlene Salts (June 04, 2008 8:11 AM)             Is Patient Diabetic? No     Visit Type:  est PCP:  [name] D.O.  Chief Complaint:  CPE and EKG.  History of Present Illness: 55 yo WM presents for CPE since turning 52.  No complaints today.  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  Denies fam hx of ischemic heart dz, colon or prostate cancer.  Ready to set up initial screening colonoscopy.  Denies voiding problems or blood in stool.  Denies CP or DOE.  Has failed to change eating habits or exercise.  Weight has remained unchanged.  Has hx of impaired fasting glucose, OSA and hypothyroidism.  Due for labs end of July.  Unknown last tetanus vaccine.      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       Never smoked.       No exercise, poor diet       Denies ETOH       3 sodas a day    Review of Systems  The patient denies anorexia, fever, weight loss, weight gain, vision loss, decreased hearing, hoarseness, chest pain, syncope, dyspnea on exertion, peripheral  edema, prolonged cough, headaches, hemoptysis, abdominal pain, melena, hematochezia, severe indigestion/heartburn, hematuria, incontinence, genital sores, muscle weakness, suspicious skin lesions, transient blindness, difficulty walking, depression, unusual weight change, abnormal bleeding, enlarged lymph nodes, angioedema, breast masses, and testicular masses.     Physical Exam  General:     alert, well-developed, well-nourished, and well-hydrated.  obese Head:     normocephalic and atraumatic.   Eyes:     pupils equal, pupils round, and pupils reactive to light.  wears glasses Ears:     EACs patent; TMs translucent and gray with good cone of light and bony landmarks.  Nose:     no external deformity and no nasal discharge.   Mouth:     good dentition and pharynx pink and moist.  low hanging uvula Neck:     supple and no masses.   Chest Wall:  No deformities, masses, tenderness or gynecomastia noted. Lungs:     Normal respiratory effort, chest expands symmetrically. Lungs are clear to auscultation, no crackles or wheezes. Heart:     Normal rate and regular rhythm. S1 and S2 normal without gallop, murmur, click, rub or other extra sounds. no AA bruits Abdomen:     soft, non-tender, normal bowel sounds, no distention, no masses, no guarding, no hepatomegaly, and no splenomegaly.  old periumbilical scar seen, w/o hernia Rectal:     no external abnormalities, no hemorrhoids, and no masses.  hemoccult neg Prostate:     Prostate gland firm and smooth, no enlargement, nodularity, tenderness, mass, asymmetry or induration. Pulses:     2+ radial and pedal pulses Extremities:     no E/C/C Neurologic:     gait normal and DTRs symmetrical and normal.   Skin:     color normal and no rashes.   Cervical Nodes:     No lymphadenopathy noted Psych:     good eye contact, not anxious appearing, and not depressed appearing.      Impression & Recommendations:  Problem # 1:  HEALTHY  ADULT MALE (ICD-V70.0) Reviewed keeping healthy checklist for men. Tetanus updated. Baseline EKG normal excpet for Right axis deviation (likely from OSA). Recommended wt loss by healthy diet and exercise. Update labs end of July. DRE done, PSA with bloodwork. Set up initial screening colonscopy. BP at goal. REcheck fasting glucose/ TSH in 4 mos.   Add ASA 81 m/g day and MVI daily to regimen.  Complete Medication List: 1)  Levothroid 50 Mcg Tabs (Levothyroxine sodium) .Marland Kitchen.. 1 tab by mouth daily  Other Orders: Gastroenterology Referral (GI) T-PSA  ([id number]) T-Comprehensive Metabolic Panel [phone]) T-Lipid Profile [id number]) DRE (G0102) Hemoccult Guaiac-1 spec.(in office) (57846) Tdap => 51yrs IM ([address]) Admin 1st Vaccine (28413)   Patient Instructions: 1)  Return in 4 months for impaired fasting glucose and recheck TSH.   ]  Tetanus/Td Vaccine    Vaccine Type: Tdap    Site: left deltoid    Mfr: GlaxoSmithKline    Dose: 0.5 ml    Route: IM    Given by: Harlene Salts    Exp. Date: 09/13/2010    Lot #: [id number]    VIS given: 11/07/07 version given June 04, 2008.

## 2011-01-20 NOTE — Assessment & Plan Note (Signed)
Summary: CPE/ boyscout    Vital Signs:  Patient profile:   55 year old male Height:      68 inches Weight:      230 pounds BMI:     35.10 O2 Sat:      97 % on Room air Temp:     98.7 degrees F oral Pulse rate:   70 / minute BP sitting:   113 / 79  (left arm) Cuff size:   large  Vitals Entered By: Payton Spark CMA (May 22, 2009 10:21 AM)  O2 Flow:  Room air CC: CPE. Fill out [employer] form.   Primary Care Paul Reed:  [name] D.O.  CC:  CPE. 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  Doing well. Staying busy at work.  Getting along well with wife and son.  Take thyroid replacement and is due to have TSH checked.  Tetanus is UTD, done in 09.  Denies fam hx of premature heart dz.  He is due for a screening colonoscopy.    Saw urologist for elevated PSA and had a normal prostate biopsy, felt to be high due to BPH but he is not having any symptoms.      Current Medications (verified): 1)  Levothroid 88 Mcg Tabs (Levothyroxine Sodium) .Paul Reed.. 1 Tab By Mouth Daily  Allergies (verified): No Known Drug Allergies  Comments:  Nurse/Medical Assistant: Payton Spark CMA (May 22, 2009 10:22 AM) The patient's medications were reviewed with the patient and were updated in the Medication List.  Past History:  Past Medical History: Reviewed history from 05/07/2008 and no changes required. hypothyroidism obesity OSA on CPAP x 13 yrs impaired fasting glucose  Past Surgical History: Reviewed history from 06/01/2007 and no changes required. 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 father died of lung cancer at 75 mother alive, thyroid d/o, SCC 3 brothers, 1 sister alive and well  Social History: Reviewed history from 06/01/2007 and no changes required. 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 Never smoked. No exercise, poor diet Denies ETOH 3 sodas a day  Review of Systems  The patient denies anorexia, fever, weight loss, weight gain, vision loss, decreased hearing, hoarseness, chest pain, syncope, dyspnea on exertion, peripheral edema, prolonged cough, headaches, hemoptysis, abdominal pain, melena, hematochezia, severe indigestion/heartburn, hematuria, incontinence, genital sores, muscle weakness, suspicious skin lesions, transient blindness, difficulty walking, depression, unusual weight change, abnormal bleeding, enlarged lymph nodes, angioedema, breast masses, and testicular masses.    Physical Exam  General:  alert, well-developed, well-nourished, well-hydrated, and overweight-appearing.   Head:  normocephalic and atraumatic.   Eyes:  pupils equal, pupils round, and pupils reactive to light.   Ears:  EACs patent; TMs translucent and gray with good cone of light and bony landmarks.  Nose:  no nasal discharge.   Mouth:  good dentition.   Neck:  supple and no masses.   Lungs:  Normal respiratory effort, chest expands symmetrically. Lungs are clear to auscultation, no crackles or wheezes. Heart:  Normal rate and regular rhythm. S1 and S2 normal without gallop, murmur, click, rub or other extra sounds. Abdomen:  soft, non-tender, normal bowel sounds, no distention, no masses, no guarding, no hepatomegaly, and no splenomegaly.  abdominal rectus muscles are spread apart Msk:  No deformity or scoliosis noted of thoracic or lumbar spine.   Pulses:  2+ radial and pedal pulses Extremities:  no LE edema Skin:  color normal.   Cervical Nodes:  No lymphadenopathy noted Psych:  good eye contact, not anxious appearing, and not depressed appearing.     Impression & Recommendations:  Problem # 1:  Preventive Health Care (ICD-V70.0) Reviewed keeping healthy checklist for men. BP at goal.  BMI obese. Work on diet, exercise, wt  loss. MVI daily. Recently had prostate biopsy, neg for cancer, has asymptomatic BPH with a PSA f/u with urology. Tdap and labs are UTD. Set up for screening colonoscopy.   [employer] physical form completed.  Problem # 2:  HYPOTHYROIDISM (ICD-244.9) Check TSH.  RF meds tomorrow after labs come back. His updated medication list for this problem includes:    Levothroid 88 Mcg Tabs (Levothyroxine sodium) .Paul Reed... 1 tab by mouth daily  Orders: T-TSH ([id number])  Labs Reviewed: TSH: 7.036 (10/22/2008)    Chol: 148 (10/22/2008)   HDL: 44 (10/22/2008)   LDL: 58 (10/22/2008)   TG: 230 (10/22/2008)  Complete Medication List: 1)  Levothroid 88 Mcg Tabs (Levothyroxine sodium) .Paul Reed.. 1 tab by mouth daily  Patient Instructions: 1)  TSH today. 2)  I will call you w/ results tomorrow and RF your Levothyroxine. 3)  Return in 6 mos for f/u of pre-diabetes and hypothyroidism.

## 2011-01-20 NOTE — Assessment & Plan Note (Signed)
Summary: f/u impaired fasting glucose/ hypothyroidism   Vital Signs:  Patient Profile:   55 Years Old Male Height:     68 inches Weight:      230 pounds BMI:     35.10 Pulse rate:   85 / minute BP sitting:   113 / 73  (left arm) Cuff size:   large  Vitals Entered By: Harlene Salts (October 22, 2008 8:17 AM)                 PCP:  [name] D.O.  Chief Complaint:  follow-up impaired fasting glucose and thyroid.  History of Present Illness: 55 yo WM seen for IFG.  He has made some dietary changes but is still not exercising.  Has failed to lose any weight.  Fasting glucose decreased from 125 to 107.  Working a lot and busy at home.  Plans to start walking.  Not checking sugars at home.  He is due for TSH.  It has been 6 months.  Taking Synthroid 50 micrograms daily.  Denies fatigue or fullness in the neck.  Sleeping well.        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       Never smoked.       No exercise, poor diet       Denies ETOH       3 sodas a day    Review of Systems  CV      Denies chest pain or discomfort, palpitations, and shortness of breath with exertion.   Physical Exam  General:     alert, well-developed, well-nourished, and well-hydrated.  obese Head:     normocephalic and atraumatic.   Eyes:     wears glasses Mouth:     good dentition and pharynx pink and moist.   Neck:     no masses.   Lungs:     Normal respiratory effort, chest expands symmetrically. Lungs are clear to auscultation, no crackles or wheezes. Heart:     normal rate, regular rhythm, and no murmur.   Extremities:     no LE edema Skin:     color normal.   Psych:     good eye contact, not anxious appearing, and not depressed appearing.      Impression & Recommendations:  Problem # 1:  IMPAIRED FASTING GLUCOSE (ICD-790.21) AM fasting glucose 107, continues to be in pre-diabetic range.  He has diabetic diet book and has made some dietary changes.  needs to work on exercise and wt loss.  Update labs and recheck in 4 mos.  Declined need for glucometer at this time. Orders: Capillary Blood Glucose ([id number]) Fingerstick (16109)   Problem # 2:  HYPOTHYROIDISM (ICD-244.9) Recheck TSH q 6 months.  RF medicine tomorrow based on TSH result for 90 day supply. His updated medication list for this problem includes:    Levothroid 50 Mcg Tabs (Levothyroxine sodium) .Marland Kitchen... 1 tab by mouth  daily  Orders: T-TSH [id number])  Labs Reviewed: TSH: 3.965 (04/09/2008)    Chol: 145 (07/17/2007)   HDL: 44 (07/17/2007)   LDL: 61 (07/17/2007)   TG: 200 (07/17/2007)   Complete Medication List: 1)  Levothroid 50 Mcg Tabs (Levothyroxine sodium) .Marland Kitchen.. 1 tab by mouth daily  Other Orders: T-Comprehensive Metabolic Panel [id number]) T-Lipid Profile ([id number]) T-PSA Total ([id number])   Patient Instructions: 1)  Have fasting labs drawn. 2)  Pick up new thyroid Rx this wk. 3)  Will send you a copy in the mail. 4)  Return in 4 months for a complete physical.   ] Laboratory Results   Blood Tests   Date/Time Recieved: October 22, 2008 8:22 AM  Date/Time Reported: October 22, 2008 8:22 AM   Glucose (fasting): 107 mg/dL   (Normal Range: 24-401)      Appended Document: f/u impaired fasting glucose/ hypothyroidism        Current Allergies: No known allergies         Impression & Recommendations:  Problem # 1:  ELEVATED PROSTATE SPECIFIC ANTIGEN (ICD-790.93) UA completely normal.  Will refer to urology for PSA >6. Orders: UA Dipstick w/o Micro (automated)  [phone]) Urology Referral  (Urology)   Complete Medication List: 1)  Levothroid 88 Mcg Tabs (Levothyroxine sodium) .Marland Kitchen.. 1 tab by mouth daily    ] Laboratory Results   Urine Tests  Date/Time Recieved: November 06, 2008 8:50 AM  Date/Time Reported: November 06, 2008 8:50 AM   Routine Urinalysis   Color: yellow Appearance: Clear Glucose: negative   (Normal Range: Negative) Bilirubin: negative   (Normal Range: Negative) Ketone: negative   (Normal Range: Negative) Spec. Gravity: >=1.030   (Normal Range: 1.003-1.035) Blood: negative   (Normal Range: Negative) pH: 5.0   (Normal Range: 5.0-8.0) Protein: negative   (Normal Range: Negative) Urobilinogen: negative   (Normal Range: 0-1) Nitrite: negative   (Normal Range: Negative) Leukocyte Esterace: negative   (Normal Range: Negative)

## 2011-01-20 NOTE — Letter (Signed)
Summary: Lipid Letter  [facility]  [address], [address]   [place], [place] [address]   Phone: [phone]  Fax: [phone]    07/19/2007  [facility] [address] [place], [place]  [address]  Dear Isaias Cowman:  We have carefully reviewed your last lipid profile from 07/17/2007 and the results are noted below with a summary of recommendations for lipid management.    Cholesterol:       145     Goal: < 200   HDL "good" Cholesterol:   44     Goal: > 39   LDL "bad" Cholesterol:     61     Goal: < 130   Triglycerides:       200     Goal: < 150    Chemistry panel shows an elevated fasting blood sugar.  You also have mild elevation in liver enzymes.  Please schedule a morning nurse visit to recheck fasting sugar.    Triglycerides are high.  I would strongly recommend regular aerobic exercise of 30-45 min most days a week and cutting back on fats in the diet.       Current Medications: 1)    Synthroid 50 Mcg  Tabs (Levothyroxine sodium) .... Take 1 tablet by mouth once a day  If you have any questions, please call. We appreciate being able to work with you.   Sincerely,    [facility] [name] DO

## 2011-01-20 NOTE — Progress Notes (Signed)
----   Converted from flag ---- ---- 11/27/2010 2:40 PM, [name] DO wrote: Can you pls call Dr Brunilda Payor at [facility] and get pt back in next wk for a kidney stone.  followed here x 1 month but still symptomatic. ------------------------------  [facility] and pt scheduled for 12/01/2010 @ 1:45pm. Pt notified of this appt. 11/27/2010 KJ LPN

## 2011-01-20 NOTE — Assessment & Plan Note (Signed)
Summary: impaired fasting glucose   Vital Signs:  Patient Profile:   55 Years Old Male Height:     68 inches Weight:      226 pounds BMI:     34.49 Pulse rate:   65 / minute BP sitting:   115 / 73  (left arm) Cuff size:   large  Vitals Entered By: Harlene Salts (May 07, 2008 8:31 AM)                 Visit Type:  f/u PCP:  [name] D.O.  Chief Complaint:  follow-up fasting glucose.  History of Present Illness: 55 yo WM with impaired fasting glucose presents for f/u fasting glucose after having 125 reading last month.  He has worked on dietary changes, wt loss and exercise, losing 8 lbs.  Fasting glucose today 105.  Denies fam hx of DM.  Denies blurry vision, polyuria or polydipsia.        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       Never smoked.       No exercise, poor diet       Denies ETOH       3 sodas a day    Review of Systems      See HPI   Physical Exam  General:     alert, well-developed, well-nourished, well-hydrated, and overweight-appearing.   Skin:     color normal and no rashes.   Psych:     good eye contact, not anxious appearing, and flat affect.      Impression & Recommendations:  Problem # 1:  IMPAIRED FASTING GLUCOSE (ICD-790.21) Repeat fasting glucose is decreased to 105 from 125 with some lifestyle change.  Advised pt to continue healthy diet, exericise and wt loss and plan to recheck in 6 mos.  He will schedule CPE in 2 mos. Orders: Capillary Blood Glucose ([id number]) Fingerstick (40102)   Complete Medication List: 1)  Levothroid 50 Mcg Tabs (Levothyroxine sodium) .Marland Kitchen.. 1  tab by mouth daily   Patient Instructions: 1)  Return in 2 months for CPE/ EKG/ prostate and set up for colonoscopy.   ] Laboratory Results   Blood Tests   Date/Time Recieved: May 07, 2008 8:36 AM  Date/Time Reported: May 07, 2008 8:37 AM   Glucose (fasting): 105 mg/dL   (Normal Range: 72-536)

## 2011-01-21 NOTE — Miscellaneous (Signed)
Summary: PT Discharge/[facility]  PT Discharge/[facility]   Imported By: Lanelle Bal 01/12/2011 09:17:26  _____________________________________________________________________  External Attachment:    Type:   Image     Comment:   External Document

## 2011-01-21 NOTE — Consult Note (Signed)
Summary: [facility]  [facility]   Imported By: Lanelle Bal 12/10/2010 14:13:18  _____________________________________________________________________  External Attachment:    Type:   Image     Comment:   External Document

## 2011-02-10 NOTE — Letter (Signed)
Summary: [facility]   [facility]   Imported By: Kassie Mends 02/04/2011 08:42:01  _____________________________________________________________________  External Attachment:    Type:   Image     Comment:   External Document

## 2011-03-30 ENCOUNTER — Other Ambulatory Visit: Payer: Self-pay | Admitting: Family Medicine

## 2011-05-09 ENCOUNTER — Encounter: Payer: Self-pay | Admitting: Family Medicine

## 2011-05-10 ENCOUNTER — Encounter: Payer: Self-pay | Admitting: Family Medicine

## 2011-05-10 ENCOUNTER — Ambulatory Visit (INDEPENDENT_AMBULATORY_CARE_PROVIDER_SITE_OTHER): Payer: 59 | Admitting: Family Medicine

## 2011-05-10 DIAGNOSIS — R319 Hematuria, unspecified: Secondary | ICD-10-CM

## 2011-05-10 DIAGNOSIS — Z130 Encounter for screening for diseases of the blood and blood-forming organs and certain disorders involving the immune mechanism: Secondary | ICD-10-CM

## 2011-05-10 DIAGNOSIS — Z0000 Encounter for general adult medical examination without abnormal findings: Secondary | ICD-10-CM

## 2011-05-10 DIAGNOSIS — R310 Gross hematuria: Secondary | ICD-10-CM

## 2011-05-10 DIAGNOSIS — E039 Hypothyroidism, unspecified: Secondary | ICD-10-CM

## 2011-05-10 DIAGNOSIS — Z13220 Encounter for screening for lipoid disorders: Secondary | ICD-10-CM

## 2011-05-10 DIAGNOSIS — N200 Calculus of kidney: Secondary | ICD-10-CM

## 2011-05-10 LAB — POCT URINALYSIS DIPSTICK
Glucose, UA: NEGATIVE
Ketones, UA: NEGATIVE
Leukocytes, UA: NEGATIVE
Nitrite, UA: NEGATIVE
Spec Grav, UA: 1.03
Urobilinogen, UA: NEGATIVE
pH, UA: 5

## 2011-05-10 MED ORDER — HYDROCHLOROTHIAZIDE 25 MG PO TABS: 25.0000 mg | ORAL_TABLET | Freq: Every day | ORAL | Status: AC

## 2011-05-10 NOTE — Patient Instructions (Signed)
Start HCTZ 25 mg every morning for both high BP (this will also help prevent kidney stones).  F/U with Dr Brunilda Payor for PSA/ kidney stones.  Update fasting labs after 05-27-11.  Return for f/u in 3 mos.

## 2011-05-10 NOTE — Progress Notes (Signed)
Subjective:    Patient ID: Paul Reed, male    DOB: 10-06-1956, 55 y.o.   MRN: [id number]  HPI 55 yo WM presents for CPE.  He has a hx of hypothyroidism, due for TSH on meds, kidney stones and an elevated PSA.  He has f/u with Dr Brunilda Payor this summer.  C/o L flank pain x 2 days  Using hydrocodone with some relief.  Denies gross hematuria, urinary frequency or dysuria.  He had a negative prostate biopsy in 2010 with an elevated PSA.  He denies chest pain or SOB and is not a smoker.  Had a normal colonoscopy in 2010.    His last labwork was 05-26-10. His last tetanus vaccine was 2009 He does not regularly exercise, eats a poor diet and does not take a vitamin.  BP 136/91  Pulse 70  Resp 20  Ht 5\' 7"  (1.702 m)  Wt 231 lb (104.781 kg)  BMI 36.18 kg/m2  SpO2 96%  Past Medical History  Diagnosis Date  . Hypothyroidism   . Obesity   . OSA on CPAP     13 years  . Impaired fasting glucose   . Chronic kidney disease     stones    Past Surgical History  Procedure Date  . 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 Cancer Father   . Thyroid disease      mother    History   Social History  . Marital Status: Married    Spouse Name: N/A    Number of Children: N/A  . Years of Education: N/A   Occupational History  . Not on file.   Social History Main Topics  . Smoking status: Never Smoker   . Smokeless tobacco: Not on file  . Alcohol Use: No  . Drug Use:   . 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 Not on file   Social History Narrative  . No narrative on file    No Known Allergies  Current outpatient prescriptions:levothyroxine (SYNTHROID, LEVOTHROID) 100 MCG tablet, take 1 tablet by mouth once daily, Disp: 30 tablet, Rfl: 3;  oxyCODONE-acetaminophen (PERCOCET) 5-325 MG per tablet, Take 1 tablet by  mouth every 8 (eight) hours as needed. 1-2 tabs by mouth TID PRN for severe pain; take with food , Disp: , Rfl: ;  hydrochlorothiazide 25 MG tablet, Take 1 tablet (25 mg total) by mouth daily., Disp: 30 tablet, Rfl: 2 meclizine (ANTIVERT) 50 MG tablet, Take 50 mg by mouth 2 (two) times daily as needed. As needed for vertigo , Disp: , Rfl: ;  ondansetron (ZOFRAN) 8 MG tablet, Take by mouth every 8 (eight) hours as needed.  , Disp: , Rfl:      Review of Systems Gen: no fevers, chills, hot flashes, night sweats, change in weight GI: no N/V/C/D GU: no dysuria, incontinence or sexual dysfunction CV: no chest pain, DOE, palpitations s or edema Pulm:  Denies CP, SOB or chronic cough      Objective:   Physical Exam Gen: alert, well groomed in NAD, obese Neck: no thyromegaly or cervical lymphadenopathy CV: RRR w/o murmur, no audible carotid bruits or abdominal aortic bruits Ext: no edema, clubbing or cyanosis Lungs: CTA bilat w/o W/R/R; nonlabored HEENT:  [place]/AT; PERRLA; oropharynx pink and moist  with good dentition Abd: soft, NT, ND, NABS, No HSM, no audible AA bruits Skin: warm and dry; no rash, pallor or jaundice Psych: does not appear anxious or depressed; answers questions appropriately GU : deferred DRE since he has f/u with Dr Brunilda Payor next month.       Assessment & Plan:  Assesment:  1. CPE- Keeping healthy checklist for men reviewed today.  Marland Kitchen  BMI 36  in the class II obesity range.     Labs ordered for next month. Colonoscopy due 2020. DRE/ PSA per Dr Brunilda Payor - due next month. Encouraged healthy diet, regular exercise, MVI daily. Return for next physical in 1 yr.   UA + for blood -- high probability of recurrent kidney stone.  Has vicodin for pain.  F/u with Dr Brunilda Payor. Start HCTZ 25 mg once daily for high BP and kidney stone prevention.  RTC in 3 mos for f/u.

## 2011-05-29 ENCOUNTER — Telehealth: Payer: Self-pay | Admitting: Family Medicine

## 2011-05-29 LAB — LIPID PANEL
Cholesterol: 157 mg/dL (ref 0–200)
HDL: 47 mg/dL (ref >39)
LDL Cholesterol: 67 mg/dL (ref 0–99)
Total CHOL/HDL Ratio: 3.3 Ratio
Triglycerides: 213 mg/dL — ABNORMAL HIGH (ref <150)
VLDL: 43 mg/dL — ABNORMAL HIGH (ref 0–40)

## 2011-05-29 LAB — CBC WITH DIFFERENTIAL/PLATELET
Basophils Absolute: 0 10*3/uL (ref 0.0–0.1)
Basophils Relative: 0 % (ref 0–1)
Eosinophils Absolute: 0.1 10*3/uL (ref 0.0–0.7)
Eosinophils Relative: 2 % (ref 0–5)
HCT: 46.7 % (ref 39.0–52.0)
Hemoglobin: 15.4 g/dL (ref 13.0–17.0)
Lymphocytes Relative: 28 % (ref 12–46)
Lymphs Abs: 2.5 10*3/uL (ref 0.7–4.0)
MCH: 31.3 pg (ref 26.0–34.0)
MCHC: 33 g/dL (ref 30.0–36.0)
MCV: 94.9 fL (ref 78.0–100.0)
Monocytes Absolute: 0.8 10*3/uL (ref 0.1–1.0)
Monocytes Relative: 9 % (ref 3–12)
Neutro Abs: 5.5 10*3/uL (ref 1.7–7.7)
Neutrophils Relative %: 61 % (ref 43–77)
Platelets: 297 10*3/uL (ref 150–400)
RBC: 4.92 MIL/uL (ref 4.22–5.81)
RDW: 12.9 % (ref 11.5–15.5)
WBC: 8.9 10*3/uL (ref 4.0–10.5)

## 2011-05-29 LAB — COMPLETE METABOLIC PANEL WITH GFR
ALT: 41 U/L (ref 0–53)
AST: 33 U/L (ref 0–37)
Albumin: 4.4 g/dL (ref 3.5–5.2)
Alkaline Phosphatase: 76 U/L (ref 39–117)
BUN: 15 mg/dL (ref 6–23)
CO2: 24 mEq/L (ref 19–32)
Calcium: 9.5 mg/dL (ref 8.4–10.5)
Chloride: 100 mEq/L (ref 96–112)
Creat: 1 mg/dL (ref 0.50–1.35)
GFR, Est African American: >60 mL/min (ref >60)
GFR, Est Non African American: >60 mL/min (ref >60)
Glucose, Bld: 129 mg/dL — ABNORMAL HIGH (ref 70–99)
Potassium: 3.9 mEq/L (ref 3.5–5.3)
Sodium: 137 mEq/L (ref 135–145)
Total Bilirubin: 0.5 mg/dL (ref 0.3–1.2)
Total Protein: 7.7 g/dL (ref 6.0–8.3)

## 2011-05-29 LAB — TSH: TSH: 1.638 u[IU]/mL (ref 0.350–4.500)

## 2011-05-29 NOTE — Telephone Encounter (Signed)
Pls let pt know that his thyroid looks perfect on current dose of Synthroid.  Repeat TSH in 6 mos. Fasting sugar is HIGH at 129 - this is into the DIABETIC range with normal liver, kidney function and normal cholesterol other than high TGs.  Ask him to return this wk for a fingerstick FASTING GLUCOSE + A1C for nurse visit (just not Tuesday).

## 2011-05-31 NOTE — Telephone Encounter (Signed)
Pt aware of the above  

## 2011-06-08 ENCOUNTER — Ambulatory Visit: Payer: 59

## 2011-06-10 ENCOUNTER — Ambulatory Visit (INDEPENDENT_AMBULATORY_CARE_PROVIDER_SITE_OTHER): Payer: 59 | Admitting: Family Medicine

## 2011-06-10 DIAGNOSIS — R739 Hyperglycemia, unspecified: Secondary | ICD-10-CM

## 2011-06-10 DIAGNOSIS — R7309 Other abnormal glucose: Secondary | ICD-10-CM

## 2011-06-10 LAB — POCT GLYCOSYLATED HEMOGLOBIN (HGB A1C): Hemoglobin A1C: 6.3

## 2011-06-10 NOTE — Progress Notes (Signed)
  Subjective:    Patient ID: Paul Reed, male    DOB: Feb 10, 1956, 55 y.o.   MRN: [id number]  HPI Pt returned for A1C bc sugar was elevated on recent labs   Review of Systems     Objective:   Physical Exam        Assessment & Plan:  A1c is in the prediabetic range. This means you need to work on getting a low sugar and low carb diet as well as getting regular exercise. I recommend that he follow back up with me in 4 months for an OV to recheck his A!C.  he is still considered impaired fasting glucose.

## 2011-06-14 ENCOUNTER — Telehealth: Payer: Self-pay | Admitting: *Deleted

## 2011-06-14 NOTE — Telephone Encounter (Signed)
Pt aware.

## 2011-07-29 ENCOUNTER — Other Ambulatory Visit: Payer: Self-pay | Admitting: Family Medicine

## 2011-08-10 ENCOUNTER — Encounter: Payer: Self-pay | Admitting: Family Medicine

## 2011-08-10 ENCOUNTER — Ambulatory Visit (INDEPENDENT_AMBULATORY_CARE_PROVIDER_SITE_OTHER): Payer: 59 | Admitting: Family Medicine

## 2011-08-10 VITALS — BP 114/76 | HR 77 | Ht 67.5 in | Wt 234.0 lb

## 2011-08-10 DIAGNOSIS — R7301 Impaired fasting glucose: Secondary | ICD-10-CM

## 2011-08-10 DIAGNOSIS — N200 Calculus of kidney: Secondary | ICD-10-CM

## 2011-08-10 NOTE — Patient Instructions (Signed)
Work on [employer] sugar/ low carb diet + 30+ min of exercise 4-5 days/ wk.  Nutritionist referral made.  Return in October for f/u pre-diabetes/ A1C.

## 2011-08-10 NOTE — Assessment & Plan Note (Signed)
I have pt info on a low sugar/ low carb diet and exercise plan today.  It is imperative that he start making changes.  Discussed cutting out his high soda consumption and will try to get him in with a nutrtionist.  F/u for A1C repeat in 2 mos.

## 2011-08-10 NOTE — Progress Notes (Signed)
  Subjective:    Patient ID: Paul Reed, male    DOB: 1956/09/28, 55 y.o.   MRN: [id number]  HPI  55 yo WM presents for f/u HCTZ start in May for both HTN and kidney stone prevention.  Has not had any more stones since starting it.  He is not having any problems from the medicine.  He has failed to make any changes to his diet after being diagnosed with IFG 2 mos ago.  He has not started exercising either.  BP 114/76  Pulse 77  Ht 5' 7.5" (1.715 m)  Wt 234 lb (106.142 kg)  BMI 36.11 kg/m2  SpO2 97%    Review of Systems  Constitutional: Negative for fatigue and unexpected weight change.  Respiratory: Negative for shortness of breath.   Cardiovascular: Negative for chest pain, palpitations and leg swelling.  Gastrointestinal: Negative for nausea.       Objective:   Physical Exam  Constitutional: He appears well-developed and well-nourished. No distress.       obese  HENT:  Mouth/Throat: Oropharynx is clear and moist.  Eyes: No scleral icterus.  Neck: No thyromegaly present.  Cardiovascular: Normal rate, regular rhythm and normal heart sounds.   Pulmonary/Chest: Effort normal and breath sounds normal.  Musculoskeletal: He exhibits no edema.  Lymphadenopathy:    He has no cervical adenopathy.  Psychiatric: He has a normal mood and affect.          Assessment & Plan:

## 2011-08-10 NOTE — Assessment & Plan Note (Signed)
BP tolerating HCTZ well which was started for kidney stone prevention.  Working well thus far with no stones in the past 2-3 mos.  He has f/u with [facility].  Will need a chemistry panel at his 2 mos f/u visit.

## 2011-10-12 ENCOUNTER — Ambulatory Visit (INDEPENDENT_AMBULATORY_CARE_PROVIDER_SITE_OTHER): Payer: 59 | Admitting: Family Medicine

## 2011-10-12 ENCOUNTER — Encounter: Payer: Self-pay | Admitting: Family Medicine

## 2011-10-12 DIAGNOSIS — R899 Unspecified abnormal finding in specimens from other organs, systems and tissues: Secondary | ICD-10-CM

## 2011-10-12 DIAGNOSIS — E669 Obesity, unspecified: Secondary | ICD-10-CM

## 2011-10-12 DIAGNOSIS — E119 Type 2 diabetes mellitus without complications: Secondary | ICD-10-CM

## 2011-10-12 DIAGNOSIS — R5381 Other malaise: Secondary | ICD-10-CM

## 2011-10-12 DIAGNOSIS — R5383 Other fatigue: Secondary | ICD-10-CM

## 2011-10-12 DIAGNOSIS — R7301 Impaired fasting glucose: Secondary | ICD-10-CM

## 2011-10-12 DIAGNOSIS — R6889 Other general symptoms and signs: Secondary | ICD-10-CM

## 2011-10-12 DIAGNOSIS — Z23 Encounter for immunization: Secondary | ICD-10-CM

## 2011-10-12 DIAGNOSIS — R109 Unspecified abdominal pain: Secondary | ICD-10-CM

## 2011-10-12 LAB — POCT GLYCOSYLATED HEMOGLOBIN (HGB A1C): Hemoglobin A1C: 6.2

## 2011-10-12 MED ORDER — METFORMIN HCL 500 MG PO TABS: 500.0000 mg | ORAL_TABLET | Freq: Two times a day (BID) | ORAL | Status: DC

## 2011-10-12 MED ORDER — OXYCODONE-ACETAMINOPHEN 5-325 MG PO TABS: 1.0000 | ORAL_TABLET | Freq: Three times a day (TID) | ORAL | Status: DC | PRN

## 2011-10-12 NOTE — Patient Instructions (Addendum)

## 2011-10-12 NOTE — Assessment & Plan Note (Signed)
He admits that he has nottlost weight yet.

## 2011-10-12 NOTE — Assessment & Plan Note (Signed)
Patient has also complained of fatigue as well. He states that this is what keeps him from exercising and doing what he knows is right. Will, check testosterone ,B12, and possibly TSH if needed.

## 2011-10-12 NOTE — Progress Notes (Signed)
Subjective:    Patient ID: Paul Reed, male    DOB: 1956/03/01, 55 y.o.   MRN: [id number]  HPI #11 glucose intolerance early diagnosis diabetes. He reports that he is not exercising he has underclassman about carbohydrate counting but does make to his exercising has not been going on occurring initially not lost any weight. #2 elevated PSA patient does have elevated PSA he neglected to tell me that initially and on he does go to urologist at least twice uses to be reevaluated for his PSA. #3 out #3 fatigue he reports having marked fatigue, tiredness. States that he knows he needs to exercise but reports from too tired to exercise. He does have a history of hypothyroidism but does not know his thyroid medication doses at this time or his TSH level at his last visit. He denies any suicidal ideation and denies being depressed.           #4 he needs to have a flu shot  #5  as well as walking out the door he reports that he needs a refill of his Percocet. He states he doesn't take a daily basis on a when necessary basis and felt very would last about 6 months to a year.  #6 he also reports having some vague abdominal pain have asked him to when he returns in 6 weeks if he still having this vague abdominal pain we'll do a more extensive workup at that time.  Review of Systems  Constitutional: Positive for activity change and fatigue.  Musculoskeletal: Positive for myalgias, back pain and arthralgias.  Psychiatric/Behavioral: Negative for suicidal ideas, self-injury, dysphoric mood and decreased concentration.       Objective:   Physical Exam    Psychiatric: His affect is blunt. He is slowed and withdrawn. He expresses no homicidal and no suicidal ideation. He expresses no suicidal plans and no homicidal plans.    vital signs BP 113/77 height 5 feet 7.5 weight 229 pounds P. 77 SpO2 97   Well-nourished well-developed white male obese no acute distress. Lungs clear to auscultation percussion cardiac S1-S2 Supple no significant adenopathy present   extremities unremarkable no visible sounds for rash patient seems these possible footfall but alert. Abdomen protuberant.        Assessment & Plan:  #1 please see notes below Will plan for lab work to evaluate it further CBC TSH B12 and testosterone lelvel to evaluate for fatigue l #2 for abnormal glucose tolerance test and diabetes his A1c was 6.2 stiffness was 6.3 with elevated blood sugar been present for off of the patient is probably early diabetic and we'll start him on metformin 500 mg one tablet twice a day. #3 because of his elevated PSA gave him a copy of his labs and I will let him follow up with his urologist as far as elevated PSA  #4 abdominal pain return if it is really persist or gets worse  #5 flu shot# to be given #6 chronic back pain will acquiesce to his request of some Percocet tablets to use on a when necessary basis we'll monitor this he is to use very sparingly.    Return in 6 weeks follow.

## 2011-10-12 NOTE — Assessment & Plan Note (Signed)
Will go forth and start him on low dose metformin.

## 2011-10-13 ENCOUNTER — Telehealth: Payer: Self-pay | Admitting: Family Medicine

## 2011-10-13 LAB — VITAMIN B12: Vitamin B-12: 599 pg/mL (ref 211–911)

## 2011-10-13 LAB — TESTOSTERONE, FREE, TOTAL, SHBG
Sex Hormone Binding: 33 nmol/L (ref 13–71)
Testosterone, Free: 67.8 pg/mL (ref 47.0–244.0)
Testosterone-% Free: 2 % (ref 1.6–2.9)
Testosterone: 340.39 ng/dL (ref 250–890)

## 2011-10-13 LAB — TSH: TSH: 2.579 u[IU]/mL (ref 0.350–4.500)

## 2011-10-14 NOTE — Telephone Encounter (Signed)
Closed

## 2011-11-22 ENCOUNTER — Encounter: Payer: Self-pay | Admitting: Family Medicine

## 2011-11-23 ENCOUNTER — Ambulatory Visit: Payer: 59 | Admitting: Family Medicine

## 2011-11-25 ENCOUNTER — Encounter: Payer: Self-pay | Admitting: Family Medicine

## 2011-11-25 ENCOUNTER — Ambulatory Visit (INDEPENDENT_AMBULATORY_CARE_PROVIDER_SITE_OTHER): Payer: 59 | Admitting: Family Medicine

## 2011-11-25 VITALS — BP 115/73 | HR 68 | Ht 67.0 in | Wt 226.0 lb

## 2011-11-25 DIAGNOSIS — R109 Unspecified abdominal pain: Secondary | ICD-10-CM

## 2011-11-25 DIAGNOSIS — R1084 Generalized abdominal pain: Secondary | ICD-10-CM

## 2011-11-25 DIAGNOSIS — R1030 Lower abdominal pain, unspecified: Secondary | ICD-10-CM

## 2011-11-25 DIAGNOSIS — N200 Calculus of kidney: Secondary | ICD-10-CM

## 2011-11-25 LAB — POCT URINALYSIS DIPSTICK
Bilirubin, UA: NEGATIVE
Glucose, UA: NEGATIVE
Ketones, UA: NEGATIVE
Leukocytes, UA: NEGATIVE
Nitrite, UA: NEGATIVE
Protein, UA: 30
Spec Grav, UA: >=1.03
Urobilinogen, UA: 0.2
pH, UA: 5

## 2011-11-25 NOTE — Patient Instructions (Signed)

## 2011-11-25 NOTE — Progress Notes (Signed)
  Subjective:    Patient ID: Paul Reed, male    DOB: 1956/08/21, 55 y.o.   MRN: [id number]  HPI State he does not know why he is here but last visit he talked about this vague abdominal pain that he needs percocet  He feels that it is kidney stones and any time he has pain he can take pain medicationupdate immunization   Review of Systems  Constitutional: Positive for fatigue.  Psychiatric/Behavioral: Positive for dysphoric mood.  All other systems reviewed and are negative.       Objective:   Physical Exam  Constitutional: He is oriented to person, place, and time. He appears well-developed and well-nourished.  HENT:  Head: Normocephalic.  Neurological: He is alert and oriented to person, place, and time.  Skin: Skin is warm.  Psychiatric: His behavior is normal.          Assessment & Plan:  Abdominal pain etiology not clear. Warned that you do not treat this problem by not straining his urine and proving if a stone is there that it passes. He will follow up w/his urologist in 2-3 weeks will recommend follow up w/his Urologist. Because there was blood found in the specimen he left after he  went home will give option of ordering CT scan. Immunization update and HX

## 2011-12-06 ENCOUNTER — Other Ambulatory Visit: Payer: Self-pay | Admitting: *Deleted

## 2011-12-06 MED ORDER — LEVOTHYROXINE SODIUM 100 MCG PO TABS: 100.0000 ug | ORAL_TABLET | Freq: Every day | ORAL | Status: DC

## 2012-01-18 ENCOUNTER — Ambulatory Visit: Payer: 59 | Admitting: Family Medicine

## 2012-04-05 ENCOUNTER — Other Ambulatory Visit: Payer: Self-pay | Admitting: Family Medicine

## 2012-07-11 ENCOUNTER — Ambulatory Visit (INDEPENDENT_AMBULATORY_CARE_PROVIDER_SITE_OTHER): Payer: 59 | Admitting: Family Medicine

## 2012-07-11 ENCOUNTER — Encounter: Payer: Self-pay | Admitting: Family Medicine

## 2012-07-11 VITALS — BP 120/78 | HR 66 | Ht 67.0 in | Wt 230.0 lb

## 2012-07-11 DIAGNOSIS — Z0000 Encounter for general adult medical examination without abnormal findings: Secondary | ICD-10-CM

## 2012-07-11 DIAGNOSIS — E8881 Metabolic syndrome: Secondary | ICD-10-CM

## 2012-07-11 DIAGNOSIS — K625 Hemorrhage of anus and rectum: Secondary | ICD-10-CM

## 2012-07-11 DIAGNOSIS — Z136 Encounter for screening for cardiovascular disorders: Secondary | ICD-10-CM

## 2012-07-11 DIAGNOSIS — R5383 Other fatigue: Secondary | ICD-10-CM

## 2012-07-11 DIAGNOSIS — M7551 Bursitis of right shoulder: Secondary | ICD-10-CM

## 2012-07-11 DIAGNOSIS — Z1211 Encounter for screening for malignant neoplasm of colon: Secondary | ICD-10-CM

## 2012-07-11 DIAGNOSIS — E039 Hypothyroidism, unspecified: Secondary | ICD-10-CM

## 2012-07-11 LAB — LIPID PANEL
Cholesterol: 152 mg/dL (ref 0–200)
HDL: 44 mg/dL (ref >39)
LDL Cholesterol: 58 mg/dL (ref 0–99)
Total CHOL/HDL Ratio: 3.5 Ratio
Triglycerides: 250 mg/dL — ABNORMAL HIGH (ref <150)
VLDL: 50 mg/dL — ABNORMAL HIGH (ref 0–40)

## 2012-07-11 LAB — CBC WITH DIFFERENTIAL/PLATELET
Basophils Absolute: 0 10*3/uL (ref 0.0–0.1)
Basophils Relative: 0 % (ref 0–1)
Eosinophils Absolute: 0.1 10*3/uL (ref 0.0–0.7)
Eosinophils Relative: 1 % (ref 0–5)
HCT: 44.8 % (ref 39.0–52.0)
Hemoglobin: 14.8 g/dL (ref 13.0–17.0)
Lymphocytes Relative: 29 % (ref 12–46)
Lymphs Abs: 2.4 10*3/uL (ref 0.7–4.0)
MCH: 30.5 pg (ref 26.0–34.0)
MCHC: 33 g/dL (ref 30.0–36.0)
MCV: 92.2 fL (ref 78.0–100.0)
Monocytes Absolute: 0.8 10*3/uL (ref 0.1–1.0)
Monocytes Relative: 9 % (ref 3–12)
Neutro Abs: 5.1 10*3/uL (ref 1.7–7.7)
Neutrophils Relative %: 61 % (ref 43–77)
Platelets: 310 10*3/uL (ref 150–400)
RBC: 4.86 MIL/uL (ref 4.22–5.81)
RDW: 13.4 % (ref 11.5–15.5)
WBC: 8.4 10*3/uL (ref 4.0–10.5)

## 2012-07-11 LAB — COMPREHENSIVE METABOLIC PANEL
ALT: 20 U/L (ref 0–53)
AST: 19 U/L (ref 0–37)
Albumin: 4.3 g/dL (ref 3.5–5.2)
Alkaline Phosphatase: 73 U/L (ref 39–117)
BUN: 12 mg/dL (ref 6–23)
CO2: 28 mEq/L (ref 19–32)
Calcium: 9.8 mg/dL (ref 8.4–10.5)
Chloride: 102 mEq/L (ref 96–112)
Creat: 1.05 mg/dL (ref 0.50–1.35)
Glucose, Bld: 100 mg/dL — ABNORMAL HIGH (ref 70–99)
Potassium: 4.3 mEq/L (ref 3.5–5.3)
Sodium: 139 mEq/L (ref 135–145)
Total Bilirubin: 0.6 mg/dL (ref 0.3–1.2)
Total Protein: 7.6 g/dL (ref 6.0–8.3)

## 2012-07-11 LAB — POCT URINALYSIS DIPSTICK
Bilirubin, UA: NEGATIVE
Glucose, UA: NEGATIVE
Ketones, UA: NEGATIVE
Leukocytes, UA: NEGATIVE
Nitrite, UA: NEGATIVE
Protein, UA: NEGATIVE
Spec Grav, UA: >=1.03
Urobilinogen, UA: 0.2
pH, UA: 6

## 2012-07-11 LAB — HEMOGLOBIN A1C
Hgb A1c MFr Bld: 6.1 % — ABNORMAL HIGH (ref <5.7)
Mean Plasma Glucose: 128 mg/dL — ABNORMAL HIGH (ref <117)

## 2012-07-11 LAB — PSA, MEDICARE: PSA: 6.18 ng/mL — ABNORMAL HIGH (ref <=4.00)

## 2012-07-11 LAB — HEMOCCULT GUIAC POC 1CARD (OFFICE): Fecal Occult Blood, POC: POSITIVE

## 2012-07-11 LAB — VITAMIN B12: Vitamin B-12: 380 pg/mL (ref 211–911)

## 2012-07-11 LAB — TSH: TSH: 1.916 u[IU]/mL (ref 0.350–4.500)

## 2012-07-11 MED ORDER — MELOXICAM 7.5 MG PO TABS: 7.5000 mg | ORAL_TABLET | Freq: Every day | ORAL | Status: AC

## 2012-07-11 NOTE — Patient Instructions (Signed)
Bursitis  Bursitis is a swelling and soreness (inflammation) of a fluid-filled sac (bursa) that overlies and protects a joint. It can be caused by injury, overuse of the joint, arthritis or infection. The joints most likely to be affected are the elbows, shoulders, hips and knees.  HOME CARE INSTRUCTIONS    Apply ice to the affected area for 15 to 20 minutes each hour while awake for 2 days. Put the ice in a plastic bag and place a towel between the bag of ice and your skin.   Rest the injured joint as much as possible, but continue to put the joint through a full range of motion, 4 times per day. (The shoulder joint especially becomes rapidly "frozen" if not used.) When the pain lessens, begin normal slow movements and usual activities.   Only take over-the-counter or prescription medicines for pain, discomfort or fever as directed by your caregiver.   Your caregiver may recommend draining the bursa and injecting medicine into the bursa. This may help the healing process.   Follow all instructions for follow-up with your caregiver. This includes any orthopedic referrals, physical therapy and rehabilitation. Any delay in obtaining necessary care could result in a delay or failure of the bursitis to heal and chronic pain.  SEEK IMMEDIATE MEDICAL CARE IF:    Your pain increases even during treatment.   You develop an oral temperature above 102 F (38.9 C) and have heat and inflammation over the involved bursa.  MAKE SURE YOU:    Understand these instructions.   Will watch your condition.   Will get help right away if you are not doing well or get worse.  Document Released: 12/03/2000 Document Revised: 11/25/2011 Document Reviewed: 11/07/2009  [employer] Patient Information 2012 [employer].

## 2012-07-11 NOTE — Progress Notes (Signed)
Subjective:    Patient ID: Paul Reed, male    DOB: 03/16/1956, 56 y.o.   MRN: [id number]  HPI    Review of Systems  Musculoskeletal: Positive for back pain. Negative for myalgias, joint swelling and gait problem.  Skin: Negative for color change and rash.  All other systems reviewed and are negative.   No Known Allergies History   Social History  . Marital Status: Married    Spouse Name: N/A    Number of Children: N/A  . Years of Education: N/A   Occupational History  . Not on file.   Social History Main Topics  . Smoking status: Never Smoker   . Smokeless tobacco: Never Used  . Alcohol Use: Yes     very infrequent  . Drug Use: No  . 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 Not on file   Social History Narrative  . No narrative on file   Family History  Problem Relation Age of Onset  . Cancer Father   . Thyroid disease      mother   Past Medical History  Diagnosis Date  . Hypothyroidism   . Obesity   . OSA on CPAP     13 years  . Impaired fasting glucose   . Chronic kidney disease     stones   Past Surgical History  Procedure Date  . 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 Constitutional: He is oriented to person, place, and time. He appears well-nourished. No distress.       Obese WM  HENT:  Head: Normocephalic and atraumatic.  Right Ear: External ear normal.  Left Ear: External ear normal.  Eyes: Pupils are equal, round, and reactive to light. Right eye exhibits no discharge. Left eye exhibits no discharge. Right conjunctiva is not injected. Right conjunctiva has no hemorrhage. Left conjunctiva is not injected. Left conjunctiva has no hemorrhage.  Neck: Normal range of  motion.  Cardiovascular: Normal rate and regular rhythm.   Pulmonary/Chest: Effort normal and breath sounds normal. No respiratory distress. He has no wheezes.  Abdominal: Soft. Bowel sounds are normal. He exhibits no distension. There is no tenderness. Hernia confirmed negative in the right inguinal area.  Genitourinary: Prostate normal, testes normal and penis normal. Rectal exam shows no external hemorrhoid, no fissure, no mass, no tenderness and anal tone normal. Guaiac positive stool. Cremasteric reflex is present. Right testis shows no mass, no swelling and no tenderness. Left testis shows no mass, no swelling and no tenderness. Circumcised.  Musculoskeletal: Normal range of motion. He exhibits no edema and no tenderness.  Neurological: He is alert and oriented to person, place, and time. No cranial nerve deficit. Coordination normal.  Skin: Skin is warm and dry. He is not diaphoretic.  Psychiatric: He has a normal mood and affect. His behavior is normal.    Results for orders placed in visit on 07/11/12  POCT URINALYSIS DIPSTICK      Component Value Range   Color, UA yellow     Clarity, UA clear     Glucose, UA neg     Bilirubin, UA neg  Ketones, UA neg     Spec Grav, UA >=1.030     Blood, UA trace-intact     pH, UA 6.0     Protein, UA neg     Urobilinogen, UA 0.2     Nitrite, UA neg     Leukocytes, UA Negative    PSA, MEDICARE      Component Value Range   PSA 6.18 (*) <=4.00 ng/mL  CBC WITH DIFFERENTIAL      Component Value Range   WBC 8.4  4.0 - 10.5 K/uL   RBC 4.86  4.22 - 5.81 MIL/uL   Hemoglobin 14.8  13.0 - 17.0 g/dL   HCT 16.1  09.6 - 04.5 %   MCV 92.2  78.0 - 100.0 fL   MCH 30.5  26.0 - 34.0 pg   MCHC 33.0  30.0 - 36.0 g/dL   RDW 40.9  81.1 - 91.4 %   Platelets 310  150 - 400 K/uL   Neutrophils Relative 61  43 - 77 %   Neutro Abs 5.1  1.7 - 7.7 K/uL   Lymphocytes Relative 29  12 - 46 %   Lymphs Abs 2.4  0.7 - 4.0 K/uL   Monocytes Relative 9  3 - 12 %    Monocytes Absolute 0.8  0.1 - 1.0 K/uL   Eosinophils Relative 1  0 - 5 %   Eosinophils Absolute 0.1  0.0 - 0.7 K/uL   Basophils Relative 0  0 - 1 %   Basophils Absolute 0.0  0.0 - 0.1 K/uL   Smear Review Criteria for review not met    COMPREHENSIVE METABOLIC PANEL      Component Value Range   Sodium 139  135 - 145 mEq/L   Potassium 4.3  3.5 - 5.3 mEq/L   Chloride 102  96 - 112 mEq/L   CO2 28  19 - 32 mEq/L   Glucose, Bld 100 (*) 70 - 99 mg/dL   BUN 12  6 - 23 mg/dL   Creat 7.82  9.56 - 2.13 mg/dL   Total Bilirubin 0.6  0.3 - 1.2 mg/dL   Alkaline Phosphatase 73  39 - 117 U/L   AST 19  0 - 37 U/L   ALT 20  0 - 53 U/L   Total Protein 7.6  6.0 - 8.3 g/dL   Albumin 4.3  3.5 - 5.2 g/dL   Calcium 9.8  8.4 - 08.6 mg/dL  TSH      Component Value Range   TSH 1.916  0.350 - 4.500 uIU/mL  LIPID PANEL      Component Value Range   Cholesterol 152  0 - 200 mg/dL   Triglycerides 578 (*) <150 mg/dL   HDL 44  >46 mg/dL   Total CHOL/HDL Ratio 3.5     VLDL 50 (*) 0 - 40 mg/dL   LDL Cholesterol 58  0 - 99 mg/dL  HEMOGLOBIN N6E      Component Value Range   Hemoglobin A1C 6.1 (*) <5.7 %   Mean Plasma Glucose 128 (*) <117 mg/dL  VITAMIN X52      Component Value Range   Vitamin B-12 380  211 - 911 pg/mL  H. PYLORI ANTIBODY, IGG      Component Value Range   H Pylori IgG 0.45    HEMOCCULT GUIAC POC 1CARD (OFFICE)      Component Value Range   Fecal Occult Blood, POC Positive     Card #1 Date  Card #2 Fecal Occult Blod, POC       Card #2 Date       Card #3 Fecal Occult Blood, POC       Card #3 Date           EKG WNL Assessment & Plan:  #1 [facility] CBC,CMP, lipid, TSH  And A1c RETURN IN 6 MONTHS Discussed rectal blood colonoscopy less thaan 3 years ago and HX of rectal bleeding occasional when he overwipes

## 2012-07-12 ENCOUNTER — Encounter: Payer: Self-pay | Admitting: *Deleted

## 2012-07-12 LAB — H. PYLORI ANTIBODY, IGG: H Pylori IgG: 0.45 {ISR}

## 2012-07-13 ENCOUNTER — Emergency Department
Admission: EM | Admit: 2012-07-13 | Discharge: 2012-07-13 | Disposition: A | Discharge status: Home / Self Care | Payer: 59 | Source: Home / Self Care

## 2012-07-13 ENCOUNTER — Encounter: Payer: Self-pay | Admitting: *Deleted

## 2012-07-13 ENCOUNTER — Emergency Department (INDEPENDENT_AMBULATORY_CARE_PROVIDER_SITE_OTHER): Payer: 59

## 2012-07-13 DIAGNOSIS — S6000XA Contusion of unspecified finger without damage to nail, initial encounter: Secondary | ICD-10-CM

## 2012-07-13 DIAGNOSIS — S60011A Contusion of right thumb without damage to nail, initial encounter: Secondary | ICD-10-CM

## 2012-07-13 DIAGNOSIS — IMO0002 Reserved for concepts with insufficient information to code with codable children: Secondary | ICD-10-CM

## 2012-07-13 DIAGNOSIS — M79609 Pain in unspecified limb: Secondary | ICD-10-CM

## 2012-07-13 HISTORY — DX: Bursopathy, unspecified: M71.9

## 2012-07-13 NOTE — ED Provider Notes (Signed)
History     CSN: [id number]  Arrival date & time 07/13/12  7829   None     Chief Complaint  Patient presents with  . Finger Injury  HPI  Patient struck his right thumb in a car door as was closing yesterday. Patient was at a [employer] trip in a [place]. Has had significant right thumb pain since this point with some associated bruising. Flexion-extension has remained intact. Has had mild distal thumb swelling. No pain or tenderness along the distal IP joint line. Pain is on palmar and dorsal side of distal thumb. Patient's had pain with grip.  Past Medical History  Diagnosis Date  . Hypothyroidism   . Obesity   . OSA on CPAP     13 years  . Impaired fasting glucose   . Chronic kidney disease     stones  . Bursitis     Past Surgical History  Procedure Date  . 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 Cancer Father   . Thyroid disease      mother    History  Substance Use Topics  . Smoking status: Never Smoker   . Smokeless tobacco: Never Used  . Alcohol Use: Yes     very infrequent      Review of Systems  All other systems reviewed and are negative.    Allergies  Review of patient's allergies indicates no known allergies.  Home Medications   Current Outpatient Rx  Name Route Sig Dispense Refill  . LEVOTHYROXINE SODIUM 100 MCG PO TABS  take 1 tablet by mouth once daily 30 tablet 3  . MECLIZINE HCL 50 MG PO TABS Oral Take 50 mg by mouth 2 (two) times daily as needed. As needed for vertigo     . MELOXICAM 7.5 MG PO TABS Oral Take 1 tablet (7.5 mg total) by mouth daily. 30 tablet 2  . METFORMIN HCL 500 MG PO TABS Oral Take 1 tablet (500 mg total) by mouth 2 (two) times daily with a meal. 60 tablet 11  . ONDANSETRON HCL 8 MG PO TABS Oral Take by mouth every 8 (eight) hours as needed.      . OXYCODONE-ACETAMINOPHEN 5-325 MG PO TABS Oral Take 1 tablet by mouth every 8 (eight) hours as  needed. 1-2 tabs by mouth TID PRN for severe pain; take with food 30 tablet 0    BP 118/78  Pulse 73  Resp 14  Ht 5\' 7"  (1.702 m)  Wt 228 lb (103.42 kg)  BMI 35.71 kg/m2  SpO2 97%  Physical Exam  Constitutional: He appears well-developed and well-nourished.  HENT:  Head: Normocephalic and atraumatic.  Eyes: Conjunctivae are normal. Pupils are equal, round, and reactive to light.  Neck: Normal range of motion. Neck supple.  Cardiovascular: Normal rate and regular rhythm.   Pulmonary/Chest: Effort normal.  Abdominal: Soft.  Musculoskeletal:       Hands:   ED Course  Procedures (including critical care time)  Labs Reviewed - No data to display Dg Finger Thumb Right  07/13/2012  *RADIOLOGY REPORT*  Clinical Data: History of injury.  Bruising of the thumbnail.  RIGHT THUMB 2+V  Comparison: None.  Findings: There is mild soft tissue swelling.  No fracture or dislocation is identified.  IMPRESSION: No evidence of fracture.  Original Report Authenticated By: Crawford Givens, M.D.     1. Contusion of thumb,  right       MDM  Will splint thumb.  RICE and tylenol. Already on mobic for bursitis. Continue.  Follow up with PCP vs. Sports medicine in 1-2 weeks.  General red flags reviewed.  Follow up as needed.      The patient and/or caregiver has been counseled thoroughly with regard to treatment plan and/or medications prescribed including dosage, schedule, interactions, rationale for use, and possible side effects and they verbalize understanding. Diagnoses and expected course of recovery discussed and will return if not improved as expected or if the condition worsens. Patient and/or caregiver verbalized understanding.             Floydene Flock, MD 07/13/12 1110

## 2012-07-13 NOTE — ED Notes (Signed)
Patient closed his right thumb in car door last night. Bruising and pain present. Used ice.

## 2012-07-13 NOTE — ED Notes (Signed)
Applied size 6 staxx splint to right thumb. Secured with [employer].

## 2012-07-20 NOTE — ED Provider Notes (Signed)
Agree with exam, assessment, and plan.   Lattie Haw, MD 07/20/12 1420

## 2012-08-09 ENCOUNTER — Other Ambulatory Visit: Payer: Self-pay | Admitting: Family Medicine

## 2012-10-10 ENCOUNTER — Encounter: Payer: Self-pay | Admitting: Family Medicine

## 2012-10-10 ENCOUNTER — Ambulatory Visit (INDEPENDENT_AMBULATORY_CARE_PROVIDER_SITE_OTHER): Payer: 59 | Admitting: Family Medicine

## 2012-10-10 VITALS — BP 120/79 | HR 80 | Ht 67.0 in | Wt 231.0 lb

## 2012-10-10 DIAGNOSIS — E119 Type 2 diabetes mellitus without complications: Secondary | ICD-10-CM

## 2012-10-10 DIAGNOSIS — R7309 Other abnormal glucose: Secondary | ICD-10-CM

## 2012-10-10 DIAGNOSIS — R739 Hyperglycemia, unspecified: Secondary | ICD-10-CM

## 2012-10-10 DIAGNOSIS — R972 Elevated prostate specific antigen [PSA]: Secondary | ICD-10-CM

## 2012-10-10 DIAGNOSIS — Z23 Encounter for immunization: Secondary | ICD-10-CM

## 2012-10-10 LAB — POCT GLYCOSYLATED HEMOGLOBIN (HGB A1C): Hemoglobin A1C: 6.1

## 2012-10-10 MED ORDER — METFORMIN HCL ER 500 MG PO TB24: 500.0000 mg | ORAL_TABLET | Freq: Every day | ORAL | Status: DC

## 2012-10-10 MED ORDER — METFORMIN HCL ER 750 MG PO TB24: 1500.0000 mg | ORAL_TABLET | Freq: Every day | ORAL | Status: DC

## 2012-10-10 NOTE — Progress Notes (Signed)
  Subjective:    Patient ID: Paul Reed, male    DOB: 05-17-1956, 56 y.o.   MRN: [id number]  HPI #1 diabetes. He reports not exercising like he should. He states his only averaging at best once a week and sounds like when he does exercise she may be under 30 minutes. Reports that the weather has been crappy (his terms) but he does admit [facility] He states that the metformin seem to have caused some diarrhea and nausea so he's not been taking it on a regular basis.  #2 hyperlipidemia we'll need to get a lipid panel next visit.  #3 hypothyroidism. Also need to get TSH when he returns in 4 months.  #4 elevated PSA. He did see his urologist who stated that PSA in July at 6.1 was the same as previous PSA and because of that he is going to see him once a year instead of every 6 months. We did not get the last labs from the urologist but we do have it now written notes that it was 6.1  #5 flu vaccination needed.    Review of Systems  Gastrointestinal: Positive for nausea and diarrhea.  All other systems reviewed and are negative.      BP 120/79  Pulse 80  Ht 5\' 7"  (1.702 m)  Wt 231 lb (104.781 kg)  BMI 36.18 kg/m2  SpO2 96% Objective:   Physical Exam  Constitutional: He is oriented to person, place, and time. He appears well-developed and well-nourished.  HENT:  Head: Normocephalic.  Neck: Neck supple.  Cardiovascular: Normal rate, regular rhythm and normal heart sounds.   Pulmonary/Chest: Effort normal and breath sounds normal. No respiratory distress.  Neurological: He is oriented to person, place, and time.  Skin: Skin is warm.      Assessment & Plan:  #1 diabetes. Stressed importance of exercising again. Since the metformin caused nausea and diarrhea we'll place him on the extended release. Initially begin at 500 today for about 2 weeks then increase to 2 tablets or thousand milligrams and then after being on that for 1-2 months then go up to  752 tablets a day for total of 1500. At that point may have to go back to the 500s to make sure he gets 2000 mg a day but will see what his A1c looks like in 4 months.  #2 elevated PSA. We'll follow urology recommendation and he will see him once a year.  #3 elevated triglycerides. Probably a lipid panel the next time he returns.  #4 flu shot given today.

## 2012-10-10 NOTE — Patient Instructions (Signed)

## 2012-12-10 ENCOUNTER — Other Ambulatory Visit: Payer: Self-pay | Admitting: Family Medicine

## 2013-01-29 ENCOUNTER — Encounter: Payer: Self-pay | Admitting: Family Medicine

## 2013-02-08 ENCOUNTER — Encounter: Payer: Self-pay | Admitting: Family Medicine

## 2013-02-08 ENCOUNTER — Ambulatory Visit (INDEPENDENT_AMBULATORY_CARE_PROVIDER_SITE_OTHER): Payer: 59 | Admitting: Family Medicine

## 2013-02-08 ENCOUNTER — Ambulatory Visit: Payer: 59 | Admitting: Family Medicine

## 2013-02-08 VITALS — BP 114/79 | HR 79 | Ht 67.0 in | Wt 231.0 lb

## 2013-02-08 DIAGNOSIS — R972 Elevated prostate specific antigen [PSA]: Secondary | ICD-10-CM

## 2013-02-08 DIAGNOSIS — E119 Type 2 diabetes mellitus without complications: Secondary | ICD-10-CM

## 2013-02-08 DIAGNOSIS — E039 Hypothyroidism, unspecified: Secondary | ICD-10-CM

## 2013-02-08 DIAGNOSIS — E781 Pure hyperglyceridemia: Secondary | ICD-10-CM

## 2013-02-08 LAB — LIPID PANEL
Cholesterol: 156 mg/dL (ref 0–200)
HDL: 46 mg/dL (ref >39)
LDL Cholesterol: 50 mg/dL (ref 0–99)
Total CHOL/HDL Ratio: 3.4 Ratio
Triglycerides: 300 mg/dL — ABNORMAL HIGH (ref <150)
VLDL: 60 mg/dL — ABNORMAL HIGH (ref 0–40)

## 2013-02-08 LAB — TSH: TSH: 2.006 u[IU]/mL (ref 0.350–4.500)

## 2013-02-08 MED ORDER — ASPIRIN EC 81 MG PO TBEC: 81.0000 mg | DELAYED_RELEASE_TABLET | Freq: Every day | ORAL | Status: AC

## 2013-02-08 NOTE — Progress Notes (Signed)
CC: Paul Reed is a 57 y.o. male is here for Diabetes   Subjective: HPI:  Pleasant 57 year old, patient of Dr. Thurmond Reed, establishing care with me today. Followup type 2 diabetes: She's been taking metformin xr 1700 milligrams most days of the week, admits to skipping doses Biaxin some mornings. Denies GI side effects such as diarrhea. Tries to watch what he eats but admits for room for improvement. No formal exercise program but [facility] has had a diabetic educator visit 2 years ago. Last A1c 6 months ago, 6.1. No urinemicroalbumin on file. Denies polyuria polyphasia or polydipsia. Denies poorly healing wounds nor motor or sensory disturbances and extremities. Denies vision loss.  Hypothyroidism followup: Continues on levothyroxine 100 mcg denies missed doses. Last TSH 6 months ago with in normal limits. Denies unintentional weight loss or gain. Denies hot or cold intolerance. Denies anxiety or depression.   History of elevated PSA: Currently sees his urologist every 6 months. Approximately 2 years ago prostate biopsy was unremarkable. Denies urinary symptoms such as urinary hesitancy, urgency, nor sensation of incomplete voiding.  History of hypertriglyceridemia: He does not drink alcohol, he does have diabetes. Triglycerides approximately 250 at last check 6 months ago. LDL looks fantastic and is in the 50s. Denies epigastric pain, right quadrant pain.  Denies shortness of breath, chest pain, orthopnea, peripheral edema, palpitations  Review Of Systems Outlined In HPI  Past Medical History  Diagnosis Date  . Hypothyroidism   . Obesity   . OSA on CPAP     13 years  . Impaired fasting glucose   . Chronic kidney disease     stones  . Bursitis      Family History  Problem Relation Age of Onset  . Cancer Father   . Thyroid disease      mother     History  Substance Use Topics  . Smoking status: Never Smoker   . Smokeless tobacco: Never Used   . Alcohol Use: Yes     Comment: very infrequent     Objective: Filed Vitals:   02/08/13 0834  BP: 114/79  Pulse: 79    General: Alert and Oriented, No Acute Distress HEENT: Pupils equal, round, reactive to light. Conjunctivae clear. Moist mucous membranes  Lungs: Clear to auscultation bilaterally, no wheezing/ronchi/rales.  Comfortable work of breathing. Good air movement. Cardiac: Regular rate and rhythm. Normal S1/S2.  No murmurs, rubs, nor gallops.   Abdomen: Obese soft nontender Feet: Dorsalis pedis pulses 1+ bilaterally.  Monofilament sensation intact on plantar and dorsal surface bilaterally.    No signs of infection, skin breakdown, nor ulceration. Extremities: No peripheral edema.  Strong peripheral pulses.  Mental Status: No depression, anxiety, nor agitation. Skin: Warm and dry.  Assessment & Plan: Paul Reed was seen today for diabetes.  Diagnoses and associated orders for this visit:  Hypertriglyceridemia - Lipid panel  ELEVATED PROSTATE SPECIFIC ANTIGEN  HYPOTHYROIDISM - TSH  Type 2 diabetes mellitus - Microalbumin / creatinine urine ratio  Other Orders - Cancel: HgB A1c - aspirin EC 81 MG tablet; Take 1 tablet (81 mg total) by mouth daily.    Hypothyroidism: Clinically controlled, due for TSH check. If within normal limits continue levothyroxine regimen. Elevated PSA: Clinically controlled, no suspicion for BPH or enlarging prostate. Continue care with urology. Take 2 diabetes: A1c of 5.9, controlled continue metformin daily. Due for urine microalbumin.  Encouraged to start a daily baby aspirin. Patient declines a rereferral to diabetic  nutrition educator Hypertriglyceridemia: We'll check lipid panel as he is fasting today and blood sugars improving expectorant was rinsed improve as well.  Return in about 3 months (around 05/08/2013), or cpe.

## 2013-02-09 ENCOUNTER — Telehealth: Payer: Self-pay | Admitting: Family Medicine

## 2013-02-09 DIAGNOSIS — E781 Pure hyperglyceridemia: Secondary | ICD-10-CM

## 2013-02-09 LAB — MICROALBUMIN / CREATININE URINE RATIO
Creatinine, Urine: 200.5 mg/dL
Microalb Creat Ratio: 4.6 mg/g (ref 0.0–30.0)
Microalb, Ur: 0.92 mg/dL (ref 0.00–1.89)

## 2013-02-09 MED ORDER — FENOFIBRATE 145 MG PO TABS: 145.0000 mg | ORAL_TABLET | Freq: Every day | ORAL | Status: DC

## 2013-02-09 NOTE — Telephone Encounter (Addendum)
Sue Lush, Will you please let Mr. Deis know that his triglycerides are still uncontrolled and seem to be rising compared to back in the summer.  Since we can't blame this on his blood sugar at this point I'd encourage him to start a medication to bring his triglycerides down.  I have sent fenofibrate/Tricor to his [facility].  I would encourage him to have this rechecked in three months to ensure the medication is helping. His urine and thyroid studies were perfect.

## 2013-02-09 NOTE — Telephone Encounter (Signed)
Left message to call back  

## 2013-02-12 NOTE — Telephone Encounter (Signed)
Pt.notified

## 2013-04-05 ENCOUNTER — Other Ambulatory Visit: Payer: Self-pay | Admitting: Family Medicine

## 2013-06-28 ENCOUNTER — Other Ambulatory Visit: Payer: Self-pay

## 2013-07-12 ENCOUNTER — Ambulatory Visit (INDEPENDENT_AMBULATORY_CARE_PROVIDER_SITE_OTHER): Payer: 59 | Admitting: Family Medicine

## 2013-07-12 ENCOUNTER — Encounter: Payer: Self-pay | Admitting: Family Medicine

## 2013-07-12 VITALS — BP 119/78 | HR 77 | Ht 67.0 in | Wt 233.0 lb

## 2013-07-12 DIAGNOSIS — E781 Pure hyperglyceridemia: Secondary | ICD-10-CM

## 2013-07-12 DIAGNOSIS — Z125 Encounter for screening for malignant neoplasm of prostate: Secondary | ICD-10-CM

## 2013-07-12 DIAGNOSIS — E119 Type 2 diabetes mellitus without complications: Secondary | ICD-10-CM

## 2013-07-12 DIAGNOSIS — Z0000 Encounter for general adult medical examination without abnormal findings: Secondary | ICD-10-CM

## 2013-07-12 LAB — LIPID PANEL
Cholesterol: 139 mg/dL (ref 0–200)
HDL: 42 mg/dL (ref >39)
LDL Cholesterol: 55 mg/dL (ref 0–99)
Total CHOL/HDL Ratio: 3.3 Ratio
Triglycerides: 210 mg/dL — ABNORMAL HIGH (ref <150)
VLDL: 42 mg/dL — ABNORMAL HIGH (ref 0–40)

## 2013-07-12 LAB — COMPLETE METABOLIC PANEL WITH GFR
ALT: 51 U/L (ref 0–53)
AST: 39 U/L — ABNORMAL HIGH (ref 0–37)
Albumin: 4.3 g/dL (ref 3.5–5.2)
Alkaline Phosphatase: 79 U/L (ref 39–117)
BUN: 11 mg/dL (ref 6–23)
CO2: 26 mEq/L (ref 19–32)
Calcium: 9.7 mg/dL (ref 8.4–10.5)
Chloride: 102 mEq/L (ref 96–112)
Creat: 1.02 mg/dL (ref 0.50–1.35)
GFR, Est African American: >89 mL/min
GFR, Est Non African American: 82 mL/min
Glucose, Bld: 117 mg/dL — ABNORMAL HIGH (ref 70–99)
Potassium: 4.7 mEq/L (ref 3.5–5.3)
Sodium: 138 mEq/L (ref 135–145)
Total Bilirubin: 0.5 mg/dL (ref 0.3–1.2)
Total Protein: 7.6 g/dL (ref 6.0–8.3)

## 2013-07-12 LAB — HEMOGLOBIN A1C
Hgb A1c MFr Bld: 6.1 % — ABNORMAL HIGH (ref <5.7)
Mean Plasma Glucose: 128 mg/dL — ABNORMAL HIGH (ref <117)

## 2013-07-12 LAB — PSA: PSA: 5.8 ng/mL — ABNORMAL HIGH (ref <=4.00)

## 2013-07-12 NOTE — Patient Instructions (Addendum)
Dr. Kaylany Tesoriero's General Advice Following Your Complete Physical Exam  The Benefits of Regular Exercise: Unless you suffer from an uncontrolled cardiovascular condition, studies strongly suggest that regular exercise and physical activity will add to both the quality and length of your life.  The World Health Organization recommends 150 minutes of moderate intensity aerobic activity every week.  This is best split over 3-4 days a week, and can be as simple as a brisk walk for just over 35 minutes "most days of the week".  This type of exercise has been shown to lower LDL-Cholesterol, lower average blood sugars, lower blood pressure, lower cardiovascular disease risk, improve memory, and increase one's overall sense of wellbeing.  The addition of anaerobic (or "strength training") exercises offers additional benefits including but not limited to increased metabolism, prevention of osteoporosis, and improved overall cholesterol levels.  How Can I Strive For A Low-Fat Diet?: Current guidelines recommend that 25-35 percent of your daily energy (food) intake should come from fats.  One might ask how can this be achieved without having to dissect each meal on a daily basis?  Switch to skim or 1% milk instead of whole milk.  Focus on lean meats such as ground turkey, fresh fish, baked chicken, and lean cuts of beef as your source of dietary protein.  Consume less than 300mg/day of dietary cholesterol.  Limit trans fatty acid consumption primarily by limiting synthetic trans fats such as partially hydrogenated oils (Ex: fried fast foods).  Focus efforts on reducing your intake of "solid" fats (Ex: Butter).  Substitute olive or vegetable oil for solid fats where possible.  Moderation of Salt Intake: Provided you don't carry a diagnosis of congestive heart failure nor renal failure, I recommend a daily allowance of no more than 2300 mg of salt (sodium).  Keeping under this daily goal is associated with a  decreased risk of cardiovascular events, creeping above it can lead to elevated blood pressures and increases your risk of cardiovascular events.  Milligrams (mg) of salt is listed on all nutrition labels, and your daily intake can add up faster than you think.  Most canned and frozen dinners can pack in over half your daily salt allowance in one meal.    Lifestyle Health Risks: Certain lifestyle choices carry specific health risks.  As you may already know, tobacco use has been associated with increasing one's risk of cardiovascular disease, pulmonary disease, numerous cancers, among many other issues.  What you may not know is that there are medications and nicotine replacement strategies that can more than double your chances of successfully quitting.  I would be thrilled to help manage your quitting strategy if you currently use tobacco products.  When it comes to alcohol use, I've yet to find an "ideal" daily allowance.  Provided an individual does not have a medical condition that is exacerbated by alcohol consumption, general guidelines determine "safe drinking" as no more than two standard drinks for a man or no more than one standard drink for a male per day.  However, much debate still exists on whether any amount of alcohol consumption is technically "safe".  My general advice, keep alcohol consumption to a minimum for general health promotion.  If you or others believe that alcohol, tobacco, or recreational drug use is interfering with your life, I would be happy to provide confidential counseling regarding treatment options.  General "Over The Counter" Nutrition Advice: Postmenopausal women should aim for a daily calcium intake of 1200 mg, however a significant   portion of this might already be provided by diets including milk, yogurt, cheese, and other dairy products.  Vitamin D has been shown to help preserve bone density, prevent fatigue, and has even been shown to help reduce falls in the  elderly.  Ensuring a daily intake of 800 Units of Vitamin D is a good place to start to enjoy the above benefits, we can easily check your Vitamin D level to see if you'd potentially benefit from supplementation beyond 800 Units a day.  Folic Acid intake should be of particular concern to women of childbearing age.  Daily consumption of 400-800 mcg of Folic Acid is recommended to minimize the chance of spinal cord defects in a fetus should pregnancy occur.    For many adults, accidents still remain one of the most common culprits when it comes to cause of death.  Some of the simplest but most effective preventitive habits you can adopt include regular seatbelt use, proper helmet use, securing firearms, and regularly testing your smoke and carbon monoxide detectors.  Jasmene Goswami B. Darielys Giglia DO [facility] [address], [address] [place], [place] [address] Phone: [phone]  

## 2013-07-12 NOTE — Progress Notes (Signed)
CC: Paul Reed is a 57 y.o. male is here for Annual Exam  Colonoscopy: 2010 without any findings or polyps Prostate: Discussed screening risks/beneifts with patient on 07/12/2013. History of elevated PSA biopsy -2010 he has followup with urology next month. Denies lower urinary tract symptoms  Influenza Vaccine: Will return flu season Pneumovax: Not indicated, no formal diagnosis of type 2 diabetes Td/Tdap: Td 2009 Zoster: (Start 57 yo)   Subjective: HPI:  Presents for complete physical exam with no complaints  No formal exercise routine, tries to watch what he eats.Over the past 2 weeks have you been bothered by: - Little interest or pleasure in doing things: No - Feeling down depressed or hopeless: No  Rare alcohol use, no tobacco or recreational drug use  He has irregularly been taking his metformin and fenofibrate denying GI disturbance right upper quadrant pain nor myalgias  Review of Systems - General ROS: negative for - chills, fever, night sweats, weight gain or weight loss Ophthalmic ROS: negative for - decreased vision Psychological ROS: negative for - anxiety or depression ENT ROS: negative for - hearing change, nasal congestion, tinnitus or allergies Hematological and Lymphatic ROS: negative for - bleeding problems, bruising or swollen lymph nodes Breast ROS: negative Respiratory ROS: no cough, shortness of breath, or wheezing Cardiovascular ROS: no chest pain or dyspnea on exertion Gastrointestinal ROS: no abdominal pain, change in bowel habits, or black or bloody stools Genito-Urinary ROS: negative for - genital discharge, genital ulcers, incontinence or abnormal bleeding from genitals Musculoskeletal ROS: negative for - joint pain or muscle pain Neurological ROS: negative for - headaches or memory loss Dermatological ROS: negative for lumps, mole changes, rash and skin lesion changes  Past Medical History  Diagnosis Date  . Hypothyroidism   . Obesity   .  OSA on CPAP     13 years  . Impaired fasting glucose   . Chronic kidney disease     stones  . Bursitis      Family History  Problem Relation Age of Onset  . Cancer Father   . Thyroid disease      mother     History  Substance Use Topics  . Smoking status: Never Smoker   . Smokeless tobacco: Never Used  . Alcohol Use: Yes     Comment: very infrequent     Objective: Filed Vitals:   07/12/13 0824  BP: 119/78  Pulse: 77    General: No Acute Distress HEENT: Atraumatic, normocephalic, conjunctivae normal without scleral icterus.  No nasal discharge, hearing grossly intact, TMs with good landmarks bilaterally with no middle ear abnormalities, posterior pharynx clear without oral lesions. Neck: Supple, trachea midline, no cervical nor supraclavicular adenopathy. Pulmonary: Clear to auscultation bilaterally without wheezing, rhonchi, nor rales. Cardiac: Regular rate and rhythm.  No murmurs, rubs, nor gallops. No peripheral edema.  2+ peripheral pulses bilaterally. Abdomen: Bowel sounds normal.  No masses.  Non-tender without rebound.  Negative Murphy's sign. Obese mild ventral hernia GU: Bilateral nontender descended testes with no inguinal hernia  MSK: Grossly intact, no signs of weakness.  Full strength throughout upper and lower extremities.  Full ROM in upper and lower extremities.  No midline spinal tenderness. Neuro: Gait unremarkable, CN II-XII grossly intact.  C5-C6 Reflex 2/4 Bilaterally, L4 Reflex 2/4 Bilaterally.  Cerebellar function intact. Skin: No rashes. Nontender sebaceous cyst on the right deltoid Psych: Alert and oriented to person/place/time.  Thought process normal. No anxiety/depression.  Assessment & Plan: Denzal was seen today for annual  exam.  Diagnoses and associated orders for this visit:  Annual physical exam - PSA  Hypertriglyceridemia - Lipid panel  Type 2 diabetes mellitus - Lipid panel - COMPLETE METABOLIC PANEL WITH GFR - Hemoglobin  A1c  Screening for prostate cancer - PSA    Healthy lifestyle interventions including but limited to regular exercise, a healthy low fat diet, moderation of salt intake, the dangers of tobacco/alcohol/recreational drug use, nutrition supplementation, and accident avoidance were discussed with the patient and a handout was provided for future reference. He was given stool cards to check for blood in stool, we discussed benefits and method of monthly self testicular exam I've encouraged him to take metformin and TriCor on a daily basis  Return in about 3 months (around 10/12/2013) for Blood sugar check.

## 2013-07-13 ENCOUNTER — Encounter: Payer: Self-pay | Admitting: Family Medicine

## 2013-08-07 ENCOUNTER — Other Ambulatory Visit: Payer: Self-pay | Admitting: *Deleted

## 2013-08-07 MED ORDER — LEVOTHYROXINE SODIUM 100 MCG PO TABS: ORAL_TABLET | ORAL | Status: DC

## 2013-08-09 ENCOUNTER — Encounter: Payer: Self-pay | Admitting: Family Medicine

## 2013-08-09 ENCOUNTER — Ambulatory Visit (INDEPENDENT_AMBULATORY_CARE_PROVIDER_SITE_OTHER): Payer: 59 | Admitting: Family Medicine

## 2013-08-09 VITALS — BP 116/80 | HR 78 | Wt 233.0 lb

## 2013-08-09 DIAGNOSIS — E119 Type 2 diabetes mellitus without complications: Secondary | ICD-10-CM

## 2013-08-09 DIAGNOSIS — K120 Recurrent oral aphthae: Secondary | ICD-10-CM

## 2013-08-09 DIAGNOSIS — Z23 Encounter for immunization: Secondary | ICD-10-CM

## 2013-08-09 MED ORDER — TRIAMCINOLONE ACETONIDE 0.1 % MT PSTE: PASTE | OROMUCOSAL | Status: DC

## 2013-08-09 NOTE — Progress Notes (Signed)
CC: Paul Reed is a 57 y.o. male is here for Diabetes   Subjective: HPI:  Type 2 Diabetes: Has stopped taking baby aspirin daily continues on metformin eye exam is up to date had this done in January. Denies vision loss, headache, motor sensory disturbances, poorly healing wounds. Denies polyuria polyphasia or polydipsia  Patient complains of sore in his mouth on the tongue has been present for 2 days described as mild/moderate "pain" worse with movement of the tongue. Improves with homemade Magic mouthwash and with cough drop. Denies recent trauma or biting the tongue. Denies dysphagia, swollen lymph nodes, nor dental pain.   Review Of Systems Outlined In HPI  Past Medical History  Diagnosis Date  . Hypothyroidism   . Obesity   . OSA on CPAP     13 years  . Impaired fasting glucose   . Chronic kidney disease     stones  . Bursitis      Family History  Problem Relation Age of Onset  . Cancer Father   . Thyroid disease      mother     History  Substance Use Topics  . Smoking status: Never Smoker   . Smokeless tobacco: Never Used  . Alcohol Use: Yes     Comment: very infrequent     Objective: Filed Vitals:   08/09/13 0836  BP: 116/80  Pulse: 78    General: Alert and Oriented, No Acute Distress HEENT: Pupils equal, round, reactive to light. Conjunctivae clear.  Moist mucous membranes there is a 2 mm shallow ulceration on the right posterior tongue adjacent to molars dentition is grossly normal there is no cervical lymphadenopathy on palpation Lungs: Clear to auscultation bilaterally, no wheezing/ronchi/rales.  Comfortable work of breathing. Good air movement. Cardiac: Regular rate and rhythm. Normal S1/S2.  No murmurs, rubs, nor gallops.   Diabetic Foot Exam: Dorsalis pedis pulses 1+ bilaterally.  Monofilament sensation intact on plantar and dorsal surface bilaterally.  No signs of infection, skin breakdown, nor ulceration. Extremities: No peripheral edema.   Strong peripheral pulses.  Mental Status: No depression, anxiety, nor agitation. Skin: Warm and dry.  Assessment & Plan: [name] was seen today for diabetes.  Diagnoses and associated orders for this visit:  Type 2 diabetes mellitus  Canker sore - triamcinolone (KENALOG) 0.1 % paste; Apply to ulcer on tongue three times a day for one week. (After eating if possible.)  Need for prophylactic vaccination and inoculation against influenza    Type 2 diabetes: A1c last month is at goal he'll continue on metformin clinically stable I encouraged to start baby aspirin he is in agreement. He will receive flu shot today Canker sore: Encouraged to continue with as needed cough drops encouraged also use triamcinolone for the next week to help with overall pain.  Return in about 6 months (around 02/09/2014).

## 2013-08-17 ENCOUNTER — Telehealth: Payer: Self-pay | Admitting: *Deleted

## 2013-08-17 MED ORDER — METFORMIN HCL ER 750 MG PO TB24: 1500.0000 mg | ORAL_TABLET | Freq: Every day | ORAL | Status: DC

## 2013-08-17 NOTE — Telephone Encounter (Signed)
Called in refill to below pharm

## 2013-08-17 NOTE — Telephone Encounter (Signed)
Our chart shows pt is taking Metformin 750mg  2 tabs daily. i have received a refill request for Metformin 500mg  2 tabs daily. Will you clarify so that I can send the refill?  Meyer Cory, LPN

## 2013-08-17 NOTE — Telephone Encounter (Signed)
Paul Reed,  Patient should be taking Metformin 750mg  24hour formulation, two tabs daily with breakfast.

## 2013-09-07 ENCOUNTER — Telehealth: Payer: Self-pay | Admitting: Family Medicine

## 2013-09-07 MED ORDER — LEVOTHYROXINE SODIUM 100 MCG PO TABS: ORAL_TABLET | ORAL | Status: DC

## 2013-09-07 NOTE — Telephone Encounter (Signed)
rx req

## 2013-09-24 ENCOUNTER — Telehealth: Payer: Self-pay | Admitting: Family Medicine

## 2013-09-24 DIAGNOSIS — E781 Pure hyperglyceridemia: Secondary | ICD-10-CM

## 2013-09-24 NOTE — Telephone Encounter (Signed)
[facility] request

## 2013-10-12 ENCOUNTER — Ambulatory Visit: Payer: 59 | Admitting: Family Medicine

## 2013-11-12 ENCOUNTER — Encounter: Payer: Self-pay | Admitting: Family Medicine

## 2013-11-12 ENCOUNTER — Ambulatory Visit (INDEPENDENT_AMBULATORY_CARE_PROVIDER_SITE_OTHER): Payer: 59 | Admitting: Family Medicine

## 2013-11-12 VITALS — BP 100/65 | HR 71 | Wt 232.0 lb

## 2013-11-12 DIAGNOSIS — E119 Type 2 diabetes mellitus without complications: Secondary | ICD-10-CM

## 2013-11-12 DIAGNOSIS — E039 Hypothyroidism, unspecified: Secondary | ICD-10-CM

## 2013-11-12 LAB — POCT GLYCOSYLATED HEMOGLOBIN (HGB A1C): Hemoglobin A1C: 6.2

## 2013-11-12 LAB — TSH: TSH: 2.229 u[IU]/mL (ref 0.350–4.500)

## 2013-11-12 NOTE — Progress Notes (Signed)
CC: Paul Reed is a 57 y.o. male is here for Diabetes   Subjective: HPI:  Followup diabetes: He continues on metformin extended release metformin 1.5 g daily without known side effects. No outside blood sugars to report. Denies polyuria polyphagia polydipsia nor vision loss.. taking an aspirin daily  Followup hypothyroidism: Continues on levothyroxine 100 mcg daily without unintentional weight loss or gain, depression, anxiety, tremor, skin or hair changes constipation or diarrhea   Review Of Systems Outlined In HPI  Past Medical History  Diagnosis Date  . Hypothyroidism   . Obesity   . OSA on CPAP     13 years  . Impaired fasting glucose   . Chronic kidney disease     stones  . Bursitis      Family History  Problem Relation Age of Onset  . Cancer Father   . Thyroid disease      mother     History  Substance Use Topics  . Smoking status: Never Smoker   . Smokeless tobacco: Never Used  . Alcohol Use: Yes     Comment: very infrequent     Objective: Filed Vitals:   11/12/13 0855  BP: 100/65  Pulse: 71    General: Alert and Oriented, No Acute Distress HEENT: Pupils equal, round, reactive to light. Conjunctivae clear.  Moist mucous membranes pharynx unremarkable Lungs: Clear to auscultation bilaterally, no wheezing/ronchi/rales.  Comfortable work of breathing. Good air movement. Cardiac: Regular rate and rhythm. Normal S1/S2.  No murmurs, rubs, nor gallops.   Abdomen: Obese and soft Extremities: No peripheral edema.  Strong peripheral pulses.  Mental Status: No depression, anxiety, nor agitation. Skin: Warm and dry.  Assessment & Plan: Paul Reed was seen today for diabetes.  Diagnoses and associated orders for this visit:  Type 2 diabetes mellitus - POCT HgB A1C  HYPOTHYROIDISM - TSH    Type 2 diabetes: A1c 6.2 well controlled therefore continue metformin, we discussed considering cutting back on dose if he is able to lose some weight over the winter,  we'll readdress this in the spring Hypothyroidism: Clinically controlled however due for TSH  Return in about 6 months (around 05/12/2014).

## 2014-03-19 ENCOUNTER — Other Ambulatory Visit: Payer: Self-pay | Admitting: *Deleted

## 2014-03-19 MED ORDER — LEVOTHYROXINE SODIUM 100 MCG PO TABS: ORAL_TABLET | ORAL | Status: DC

## 2014-03-28 ENCOUNTER — Other Ambulatory Visit: Payer: Self-pay

## 2014-05-14 ENCOUNTER — Ambulatory Visit: Payer: 59 | Admitting: Family Medicine

## 2014-05-14 DIAGNOSIS — Z0289 Encounter for other administrative examinations: Secondary | ICD-10-CM

## 2014-06-24 ENCOUNTER — Other Ambulatory Visit: Payer: Self-pay | Admitting: *Deleted

## 2014-06-24 MED ORDER — LEVOTHYROXINE SODIUM 100 MCG PO TABS: ORAL_TABLET | ORAL | Status: DC

## 2014-06-24 NOTE — Telephone Encounter (Signed)
30 days refill sent and comment for office appt needed. Corliss SkainsJamie Teddie Curd, CMA

## 2014-08-02 ENCOUNTER — Other Ambulatory Visit: Payer: Self-pay | Admitting: *Deleted

## 2014-08-02 MED ORDER — LEVOTHYROXINE SODIUM 100 MCG PO TABS: ORAL_TABLET | ORAL | Status: DC

## 2014-09-06 ENCOUNTER — Ambulatory Visit (INDEPENDENT_AMBULATORY_CARE_PROVIDER_SITE_OTHER): Payer: 59 | Admitting: Family Medicine

## 2014-09-06 ENCOUNTER — Encounter: Payer: Self-pay | Admitting: Family Medicine

## 2014-09-06 VITALS — BP 117/74 | HR 71 | Ht 68.0 in | Wt 230.0 lb

## 2014-09-06 DIAGNOSIS — E781 Pure hyperglyceridemia: Secondary | ICD-10-CM

## 2014-09-06 DIAGNOSIS — Z0000 Encounter for general adult medical examination without abnormal findings: Secondary | ICD-10-CM

## 2014-09-06 DIAGNOSIS — Z23 Encounter for immunization: Secondary | ICD-10-CM

## 2014-09-06 DIAGNOSIS — E039 Hypothyroidism, unspecified: Secondary | ICD-10-CM

## 2014-09-06 DIAGNOSIS — R972 Elevated prostate specific antigen [PSA]: Secondary | ICD-10-CM

## 2014-09-06 LAB — LIPID PANEL
Cholesterol: 144 mg/dL (ref 0–200)
HDL: 50 mg/dL (ref >39)
LDL Cholesterol: 60 mg/dL (ref 0–99)
Total CHOL/HDL Ratio: 2.9 Ratio
Triglycerides: 168 mg/dL — ABNORMAL HIGH (ref <150)
VLDL: 34 mg/dL (ref 0–40)

## 2014-09-06 LAB — COMPLETE METABOLIC PANEL WITH GFR
ALT: 71 U/L — ABNORMAL HIGH (ref 0–53)
AST: 55 U/L — ABNORMAL HIGH (ref 0–37)
Albumin: 4.2 g/dL (ref 3.5–5.2)
Alkaline Phosphatase: 94 U/L (ref 39–117)
BUN: 10 mg/dL (ref 6–23)
CO2: 23 mEq/L (ref 19–32)
Calcium: 9.5 mg/dL (ref 8.4–10.5)
Chloride: 103 mEq/L (ref 96–112)
Creat: 0.95 mg/dL (ref 0.50–1.35)
GFR, Est African American: >89 mL/min
GFR, Est Non African American: 89 mL/min
Glucose, Bld: 120 mg/dL — ABNORMAL HIGH (ref 70–99)
Potassium: 4.4 mEq/L (ref 3.5–5.3)
Sodium: 138 mEq/L (ref 135–145)
Total Bilirubin: 0.7 mg/dL (ref 0.2–1.2)
Total Protein: 7.4 g/dL (ref 6.0–8.3)

## 2014-09-06 MED ORDER — LEVOTHYROXINE SODIUM 100 MCG PO TABS: ORAL_TABLET | ORAL | Status: DC

## 2014-09-06 NOTE — Progress Notes (Signed)
CC: Paul Reed is a 58 y.o. male is here for Annual Exam   Subjective: HPI:  Colonoscopy: 2010 without any findings or polyps Prostate: Discussed screening risks/beneifts with patient today, will check PSA  Influenza Vaccine: Will receive flu shot today Pneumovax: No current indication, no formal diagnosis of type 2 diabetes Td/Tdap: 2009 got Td up-to-date Zoster: (Start 58 yo)  No alcohol use, tobacco use or recreational drug use. He is around secondhand smoke everyday he is at his work. No formal exercise routine  Requesting refills on levothyroxine  Review of Systems - General ROS: negative for - chills, fever, night sweats, weight gain or weight loss Ophthalmic ROS: negative for - decreased vision Psychological ROS: negative for - anxiety or depression ENT ROS: negative for - hearing change, nasal congestion, tinnitus or allergies Hematological and Lymphatic ROS: negative for - bleeding problems, bruising or swollen lymph nodes Breast ROS: negative Respiratory ROS: no cough, shortness of breath, or wheezing Cardiovascular ROS: no chest pain or dyspnea on exertion Gastrointestinal ROS: no abdominal pain, change in bowel habits, or black or bloody stools Genito-Urinary ROS: negative for - genital discharge, genital ulcers, incontinence or abnormal bleeding from genitals Musculoskeletal ROS: negative for - joint pain or muscle pain Neurological ROS: negative for - headaches or memory loss Dermatological ROS: negative for lumps, mole changes, rash and skin lesion changes Past Medical History  Diagnosis Date  . Hypothyroidism   . Obesity   . OSA on CPAP     13 years  . Impaired fasting glucose   . Chronic kidney disease     stones  . Bursitis     Past Surgical History  Procedure Laterality Date  . 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 Cancer Father   . Thyroid disease      mother    History    Social History  . Marital Status: Married    Spouse Name: N/A    Number of Children: N/A  . Years of Education: N/A   Occupational History  . Not on file.   Social History Main Topics  . Smoking status: Never Smoker   . Smokeless tobacco: Never Used  . Alcohol Use: Yes     Comment: very infrequent  . Drug Use: No  . 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 Not on file   Social History Narrative  . No narrative on file     Objective: BP 117/74  Pulse 71  Ht  (1.727 m)  Wt 230 lb (104.327 kg)  BMI 34.98 kg/m2  General: No Acute Distress HEENT: Atraumatic, normocephalic, conjunctivae normal without scleral icterus.  No nasal discharge, hearing grossly intact, TMs with good landmarks bilaterally with no middle ear abnormalities, posterior pharynx clear without oral lesions. Neck: Supple, trachea midline, no cervical nor supraclavicular adenopathy. Pulmonary: Clear to auscultation bilaterally without wheezing, rhonchi, nor rales. Cardiac: Regular rate and rhythm.  No murmurs, rubs, nor gallops. No peripheral edema.  2+ peripheral pulses bilaterally. Abdomen: Bowel sounds normal.  No masses.  Non-tender without rebound.  Negative Murphy's sign. GU: Bilateral descended testes without inguinal hernia  MSK: Grossly intact, no signs of weakness.  Full strength throughout upper and lower extremities.  Full ROM in upper and lower extremities.  No  midline spinal tenderness. Neuro: Gait unremarkable, CN II-XII grossly intact.  C5-C6 Reflex 2/4 Bilaterally, L4 Reflex 2/4 Bilaterally.  Cerebellar function intact. Skin: No rashes. Noninflamed sebaceous cyst on the back of the right proximal arm Psych: Alert and oriented to person/place/time.  Thought process normal. No anxiety/depression.  Assessment & Plan: Paul Reed was seen today for annual exam.  Diagnoses and  associated orders for this visit:  Annual physical exam - TSH - Lipid panel - COMPLETE METABOLIC PANEL WITH GFR - PSA  HYPOTHYROIDISM - TSH  Hypertriglyceridemia - Lipid panel  ELEVATED PROSTATE SPECIFIC ANTIGEN - PSA  Other Orders - levothyroxine (SYNTHROID, LEVOTHROID) 100 MCG tablet; take 1 tablet by mouth once daily    Healthy lifestyle interventions including but not limited to regular exercise, a healthy low fat diet, moderation of salt intake, the dangers of tobacco/alcohol/recreational drug use, nutrition supplementation, and accident avoidance were discussed with the patient and a handout was provided for future reference. Due for TSH check, lipid panel, PSA and fasting blood sugar surveillance  Return in about 6 months (around 03/07/2015) for Thyroid Check.

## 2014-09-06 NOTE — Patient Instructions (Signed)
Dr. Jnyah Brazee's General Advice Following Your Complete Physical Exam  The Benefits of Regular Exercise: Unless you suffer from an uncontrolled cardiovascular condition, studies strongly suggest that regular exercise and physical activity will add to both the quality and length of your life.  The World Health Organization recommends 150 minutes of moderate intensity aerobic activity every week.  This is best split over 3-4 days a week, and can be as simple as a brisk walk for just over 35 minutes "most days of the week".  This type of exercise has been shown to lower LDL-Cholesterol, lower average blood sugars, lower blood pressure, lower cardiovascular disease risk, improve memory, and increase one's overall sense of wellbeing.  The addition of anaerobic (or "strength training") exercises offers additional benefits including but not limited to increased metabolism, prevention of osteoporosis, and improved overall cholesterol levels.  How Can I Strive For A Low-Fat Diet?: Current guidelines recommend that 25-35 percent of your daily energy (food) intake should come from fats.  One might ask how can this be achieved without having to dissect each meal on a daily basis?  Switch to skim or 1% milk instead of whole milk.  Focus on lean meats such as ground turkey, fresh fish, baked chicken, and lean cuts of beef as your source of dietary protein.  Limit saturated fat consumption to less than 10% of your daily caloric intake.  Limit trans fatty acid consumption primarily by limiting synthetic trans fats such as partially hydrogenated oils (Ex: fried fast foods).  Substitute olive or vegetable oil for solid fats where possible.  Moderation of Salt Intake: Provided you don't carry a diagnosis of congestive heart failure nor renal failure, I recommend a daily allowance of no more than 2300 mg of salt (sodium).  Keeping under this daily goal is associated with a decreased risk of cardiovascular events, creeping  above it can lead to elevated blood pressures and increases your risk of cardiovascular events.  Milligrams (mg) of salt is listed on all nutrition labels, and your daily intake can add up faster than you think.  Most canned and frozen dinners can pack in over half your daily salt allowance in one meal.    Lifestyle Health Risks: Certain lifestyle choices carry specific health risks.  As you may already know, tobacco use has been associated with increasing one's risk of cardiovascular disease, pulmonary disease, numerous cancers, among many other issues.  What you may not know is that there are medications and nicotine replacement strategies that can more than double your chances of successfully quitting.  I would be thrilled to help manage your quitting strategy if you currently use tobacco products.  When it comes to alcohol use, I've yet to find an "ideal" daily allowance.  Provided an individual does not have a medical condition that is exacerbated by alcohol consumption, general guidelines determine "safe drinking" as no more than two standard drinks for a man or no more than one standard drink for a male per day.  However, much debate still exists on whether any amount of alcohol consumption is technically "safe".  My general advice, keep alcohol consumption to a minimum for general health promotion.  If you or others believe that alcohol, tobacco, or recreational drug use is interfering with your life, I would be happy to provide confidential counseling regarding treatment options.  General "Over The Counter" Nutrition Advice: Postmenopausal women should aim for a daily calcium intake of 1200 mg, however a significant portion of this might already be   provided by diets including milk, yogurt, cheese, and other dairy products.  Vitamin D has been shown to help preserve bone density, prevent fatigue, and has even been shown to help reduce falls in the elderly.  Ensuring a daily intake of 800 Units of  Vitamin D is a good place to start to enjoy the above benefits, we can easily check your Vitamin D level to see if you'd potentially benefit from supplementation beyond 800 Units a day.  Folic Acid intake should be of particular concern to women of childbearing age.  Daily consumption of 400-800 mcg of Folic Acid is recommended to minimize the chance of spinal cord defects in a fetus should pregnancy occur.    For many adults, accidents still remain one of the most common culprits when it comes to cause of death.  Some of the simplest but most effective preventitive habits you can adopt include regular seatbelt use, proper helmet use, securing firearms, and regularly testing your smoke and carbon monoxide detectors.  Paul Reed B. Waverly Tarquinio DO [facility] [address], [address] [place], [place] [address] Phone: [phone]  

## 2014-09-07 LAB — PSA: PSA: 6.59 ng/mL — ABNORMAL HIGH (ref <=4.00)

## 2014-09-07 LAB — TSH: TSH: 4.386 u[IU]/mL (ref 0.350–4.500)

## 2014-09-09 ENCOUNTER — Telehealth: Payer: Self-pay | Admitting: Family Medicine

## 2014-09-09 DIAGNOSIS — E781 Pure hyperglyceridemia: Secondary | ICD-10-CM

## 2014-09-09 MED ORDER — FENOFIBRATE 145 MG PO TABS: 145.0000 mg | ORAL_TABLET | Freq: Every day | ORAL | Status: DC

## 2014-09-09 MED ORDER — LEVOTHYROXINE SODIUM 100 MCG PO TABS: ORAL_TABLET | ORAL | Status: DC

## 2014-09-09 NOTE — Telephone Encounter (Signed)
Pt.notified

## 2014-09-09 NOTE — Telephone Encounter (Signed)
Paul Reed, Will you please let patient know that his thyroid supplement appears to be adequate therefore I've sent more refills to his [facility]. LDL cholesterol is at goal however triglycerides are just barely elevated and seem to be causing some mild liver inflammation.  This can be improved with engaging in 30-45 minutes of moderate exercise most days of the week along with continuing with fenofibrate which I also sent to his pharmacy.  The PSA prostate test was 6.6, up about a point from last year.  I'd recommend he f/u with his urologist to share this news in the near future and that he return to recheck triglycerides in three months.

## 2014-09-27 ENCOUNTER — Telehealth: Payer: Self-pay | Admitting: *Deleted

## 2014-09-27 NOTE — Telephone Encounter (Signed)
Pharmacy notified that fenofibrate was denied. Paul SkainsJamie Geovannie Reed, CMA

## 2014-09-30 ENCOUNTER — Telehealth: Payer: Self-pay | Admitting: Family Medicine

## 2014-09-30 MED ORDER — FENOFIBRATE 160 MG PO TABS: 160.0000 mg | ORAL_TABLET | Freq: Every day | ORAL | Status: DC

## 2014-09-30 NOTE — Telephone Encounter (Signed)
Sue Lushndrea, Will you please let patient know that [employer] [employer] is denying coverage for fenofibrate at 145mg  and instead will only cover the 160mg  formulation therefore I've sent a new Rx for this to his [facility].

## 2014-09-30 NOTE — Telephone Encounter (Signed)
Pt.notified

## 2015-10-27 ENCOUNTER — Other Ambulatory Visit: Payer: Self-pay | Admitting: Family Medicine

## 2015-10-27 MED ORDER — LEVOTHYROXINE SODIUM 100 MCG PO TABS: ORAL_TABLET | ORAL | Status: DC

## 2015-10-28 ENCOUNTER — Ambulatory Visit (INDEPENDENT_AMBULATORY_CARE_PROVIDER_SITE_OTHER): Payer: 59 | Admitting: Family Medicine

## 2015-10-28 ENCOUNTER — Encounter: Payer: Self-pay | Admitting: Family Medicine

## 2015-10-28 ENCOUNTER — Ambulatory Visit (INDEPENDENT_AMBULATORY_CARE_PROVIDER_SITE_OTHER): Payer: 59

## 2015-10-28 VITALS — BP 116/77 | HR 74 | Wt 238.0 lb

## 2015-10-28 DIAGNOSIS — Z23 Encounter for immunization: Secondary | ICD-10-CM | POA: Diagnosis not present

## 2015-10-28 DIAGNOSIS — Z0000 Encounter for general adult medical examination without abnormal findings: Secondary | ICD-10-CM | POA: Diagnosis not present

## 2015-10-28 DIAGNOSIS — R079 Chest pain, unspecified: Secondary | ICD-10-CM

## 2015-10-28 DIAGNOSIS — R739 Hyperglycemia, unspecified: Secondary | ICD-10-CM | POA: Insufficient documentation

## 2015-10-28 LAB — LIPID PANEL
Cholesterol: 144 mg/dL (ref 125–200)
HDL: 46 mg/dL (ref >=40)
LDL Cholesterol: 66 mg/dL (ref <130)
Total CHOL/HDL Ratio: 3.1 Ratio (ref <=5.0)
Triglycerides: 158 mg/dL — ABNORMAL HIGH (ref <150)
VLDL: 32 mg/dL — ABNORMAL HIGH (ref <30)

## 2015-10-28 LAB — CBC
HCT: 43.7 % (ref 39.0–52.0)
Hemoglobin: 15.1 g/dL (ref 13.0–17.0)
MCH: 31.3 pg (ref 26.0–34.0)
MCHC: 34.6 g/dL (ref 30.0–36.0)
MCV: 90.7 fL (ref 78.0–100.0)
MPV: 9.7 fL (ref 8.6–12.4)
Platelets: 286 10*3/uL (ref 150–400)
RBC: 4.82 MIL/uL (ref 4.22–5.81)
RDW: 13.2 % (ref 11.5–15.5)
WBC: 8.1 10*3/uL (ref 4.0–10.5)

## 2015-10-28 LAB — COMPLETE METABOLIC PANEL WITH GFR
ALT: 55 U/L — ABNORMAL HIGH (ref 9–46)
AST: 51 U/L — ABNORMAL HIGH (ref 10–35)
Albumin: 4.1 g/dL (ref 3.6–5.1)
Alkaline Phosphatase: 93 U/L (ref 40–115)
BUN: 11 mg/dL (ref 7–25)
CO2: 24 mmol/L (ref 20–31)
Calcium: 9.6 mg/dL (ref 8.6–10.3)
Chloride: 102 mmol/L (ref 98–110)
Creat: 0.81 mg/dL (ref 0.70–1.33)
GFR, Est African American: >89 mL/min (ref >=60)
GFR, Est Non African American: >89 mL/min (ref >=60)
Glucose, Bld: 142 mg/dL — ABNORMAL HIGH (ref 65–99)
Potassium: 4.2 mmol/L (ref 3.5–5.3)
Sodium: 134 mmol/L — ABNORMAL LOW (ref 135–146)
Total Bilirubin: 0.6 mg/dL (ref 0.2–1.2)
Total Protein: 7.7 g/dL (ref 6.1–8.1)

## 2015-10-28 LAB — TSH: TSH: 7.446 u[IU]/mL — ABNORMAL HIGH (ref 0.350–4.500)

## 2015-10-28 NOTE — Progress Notes (Signed)
CC: Paul Reed is a 59 y.o. male is here for Annual Exam and Right Shoulder Pain   Subjective: HPI:  Colonoscopy: 2010 without any findings or polyps Prostate: Discussed screening risks/beneifts with patient today, will check PSA   Influenza Vaccine: will receive today Pneumovax: No current indication, no formal diagnosis of type 2 diabetes Td/Tdap: 2009 got Td up-to-date Zoster: (Start 59 yo)  Complains of right shoulder pain localized in the back of the shoulder nonradiating. Worse with any abduction beyond 90. It's been present ever since July. He also feels that it's difficult to raise his arm above his head without having pain or restricted feeling. His father died at the age of 57 due to metastatic lung cancer that was found after he was having shoulder pain identical to what the patient is experiencing today.  Review of Systems - General ROS: negative for - chills, fever, night sweats, weight gain or weight loss Ophthalmic ROS: negative for - decreased vision Psychological ROS: negative for - anxiety or depression ENT ROS: negative for - hearing change, nasal congestion, tinnitus or allergies Hematological and Lymphatic ROS: negative for - bleeding problems, bruising or swollen lymph nodes Breast ROS: negative Respiratory ROS: no cough, shortness of breath, or wheezing Cardiovascular ROS: no chest pain or dyspnea on exertion Gastrointestinal ROS: no abdominal pain, change in bowel habits, or black or bloody stools Genito-Urinary ROS: negative for - genital discharge, genital ulcers, incontinence or abnormal bleeding from genitals Musculoskeletal ROS: negative for - joint pain or muscle pain other than that described above Neurological ROS: negative for - headaches or memory loss Dermatological ROS: negative for lumps, mole changes, rash and skin lesion changes  Past Medical History  Diagnosis Date  . Hypothyroidism   . Obesity   . OSA on CPAP     13 years  . Impaired  fasting glucose   . Chronic kidney disease     stones  . Bursitis     Past Surgical History  Procedure Laterality Date  . 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 Cancer Father   . Thyroid disease      mother    Social History   Social History  . Marital Status: Married    Spouse Name: N/A  . Number of Children: N/A  . Years of Education: N/A   Occupational History  . Not on file.   Social History Main Topics  . Smoking status: Never Smoker   . Smokeless tobacco: Never Used  . Alcohol Use: Yes     Comment: very infrequent  . Drug Use: No  . Sexual Activity: Yes     Comment: Engineer, structuralsystems engineer, [employer], married, 57 year old son, noexercise, poor diet, 3 soda's daily   Other Topics Concern  . Not on file   Social History Narrative  . No narrative on file     Objective: BP 116/77 mmHg  Pulse 74  Wt 238 lb (107.956 kg)  General: No Acute Distress HEENT: Atraumatic, normocephalic, conjunctivae normal without scleral icterus.  No nasal discharge, hearing grossly intact, TMs with good landmarks bilaterally with no middle ear abnormalities, posterior pharynx clear without oral lesions. Neck: Supple, trachea midline, no cervical nor supraclavicular adenopathy. Pulmonary: Clear to auscultation bilaterally without wheezing, rhonchi, nor rales. Cardiac: Regular rate and rhythm.  No murmurs, rubs, nor gallops. No peripheral edema.  2+ peripheral pulses bilaterally. Abdomen: Bowel sounds  normal.  No masses.  Non-tender without rebound.  Negative Murphy's sign. MSK: Grossly intact, no signs of weakness.  Full strength throughout upper and lower extremities.  Full ROM in upper and lower extremities.  No midline spinal tenderness. Right shoulder exam reveals full range of motion and strength in all planes of motion and with individual rotator cuff testing. No overlying redness warmth or swelling.  Neer's test  positive.  Hawkins test positive. Empty can positive. Crossarm test negative. O'Brien's test negative. Apprehension test negative. Speed's test negative. Neuro: Gait unremarkable, CN II-XII grossly intact.  C5-C6 Reflex 2/4 Bilaterally, L4 Reflex 2/4 Bilaterally.  Cerebellar function intact. Skin: No rashes. Psych: Alert and oriented to person/place/time.  Thought process normal. No anxiety/depression.  Assessment & Plan: Paul Reed was seen today for annual exam and right shoulder pain.  Diagnoses and all orders for this visit:  Annual physical exam -     TSH -     COMPLETE METABOLIC PANEL WITH GFR -     CBC -     Lipid panel -     PSA -     DG Chest 2 View; Future  Hyperglycemia  Chest pain, unspecified chest pain type -     DG Chest 2 View; Future  Healthy lifestyle interventions including but not limited to regular exercise, a healthy low fat diet, moderation of salt intake, the dangers of tobacco/alcohol/recreational drug use, nutrition supplementation, and accident avoidance were discussed with the patient and a handout was provided for future reference.  Reassurance provided right shoulder pain is most likely due to impingement syndrome however with his father's history of lung cancer I like to calm the patient's nerves about him possibly having lung cancer with a chest x-ray today. If the chest x-ray is normal I will provide him with home rehabilitation exercises to do on a daily basis.  He has not seen his urologist in many years.  Return in about 6 months (around 04/26/2016) for Thyroid.

## 2015-10-28 NOTE — Patient Instructions (Signed)
Dr. Tiffony Kite's General Advice Following Your Complete Physical Exam  The Benefits of Regular Exercise: Unless you suffer from an uncontrolled cardiovascular condition, studies strongly suggest that regular exercise and physical activity will add to both the quality and length of your life.  The World Health Organization recommends 150 minutes of moderate intensity aerobic activity every week.  This is best split over 3-4 days a week, and can be as simple as a brisk walk for just over 35 minutes "most days of the week".  This type of exercise has been shown to lower LDL-Cholesterol, lower average blood sugars, lower blood pressure, lower cardiovascular disease risk, improve memory, and increase one's overall sense of wellbeing.  The addition of anaerobic (or "strength training") exercises offers additional benefits including but not limited to increased metabolism, prevention of osteoporosis, and improved overall cholesterol levels.  How Can I Strive For A Low-Fat Diet?: Current guidelines recommend that 25-35 percent of your daily energy (food) intake should come from fats.  One might ask how can this be achieved without having to dissect each meal on a daily basis?  Switch to skim or 1% milk instead of whole milk.  Focus on lean meats such as ground turkey, fresh fish, baked chicken, and lean cuts of beef as your source of dietary protein.  Limit saturated fat consumption to less than 10% of your daily caloric intake.  Limit trans fatty acid consumption primarily by limiting synthetic trans fats such as partially hydrogenated oils (Ex: fried fast foods).  Substitute olive or vegetable oil for solid fats where possible.  Moderation of Salt Intake: Provided you don't carry a diagnosis of congestive heart failure nor renal failure, I recommend a daily allowance of no more than 2300 mg of salt (sodium).  Keeping under this daily goal is associated with a decreased risk of cardiovascular events, creeping  above it can lead to elevated blood pressures and increases your risk of cardiovascular events.  Milligrams (mg) of salt is listed on all nutrition labels, and your daily intake can add up faster than you think.  Most canned and frozen dinners can pack in over half your daily salt allowance in one meal.    Lifestyle Health Risks: Certain lifestyle choices carry specific health risks.  As you may already know, tobacco use has been associated with increasing one's risk of cardiovascular disease, pulmonary disease, numerous cancers, among many other issues.  What you may not know is that there are medications and nicotine replacement strategies that can more than double your chances of successfully quitting.  I would be thrilled to help manage your quitting strategy if you currently use tobacco products.  When it comes to alcohol use, I've yet to find an "ideal" daily allowance.  Provided an individual does not have a medical condition that is exacerbated by alcohol consumption, general guidelines determine "safe drinking" as no more than two standard drinks for a man or no more than one standard drink for a male per day.  However, much debate still exists on whether any amount of alcohol consumption is technically "safe".  My general advice, keep alcohol consumption to a minimum for general health promotion.  If you or others believe that alcohol, tobacco, or recreational drug use is interfering with your life, I would be happy to provide confidential counseling regarding treatment options.  General "Over The Counter" Nutrition Advice: Postmenopausal women should aim for a daily calcium intake of 1200 mg, however a significant portion of this might already be   provided by diets including milk, yogurt, cheese, and other dairy products.  Vitamin D has been shown to help preserve bone density, prevent fatigue, and has even been shown to help reduce falls in the elderly.  Ensuring a daily intake of 800 Units of  Vitamin D is a good place to start to enjoy the above benefits, we can easily check your Vitamin D level to see if you'd potentially benefit from supplementation beyond 800 Units a day.  Folic Acid intake should be of particular concern to women of childbearing age.  Daily consumption of 400-800 mcg of Folic Acid is recommended to minimize the chance of spinal cord defects in a fetus should pregnancy occur.    For many adults, accidents still remain one of the most common culprits when it comes to cause of death.  Some of the simplest but most effective preventitive habits you can adopt include regular seatbelt use, proper helmet use, securing firearms, and regularly testing your smoke and carbon monoxide detectors.  Paul Reed B. Paul Coster DO [facility] [address], [address] , [place] [address] Phone: [phone]  

## 2015-10-29 ENCOUNTER — Telehealth: Payer: Self-pay | Admitting: Family Medicine

## 2015-10-29 DIAGNOSIS — E119 Type 2 diabetes mellitus without complications: Secondary | ICD-10-CM | POA: Insufficient documentation

## 2015-10-29 DIAGNOSIS — R972 Elevated prostate specific antigen [PSA]: Secondary | ICD-10-CM

## 2015-10-29 LAB — PSA: PSA: 7.41 ng/mL — ABNORMAL HIGH (ref <=4.00)

## 2015-10-29 MED ORDER — LEVOTHYROXINE SODIUM 112 MCG PO TABS: ORAL_TABLET | ORAL | Status: DC

## 2015-10-29 NOTE — Telephone Encounter (Signed)
Awaiting call back.

## 2015-10-29 NOTE — Telephone Encounter (Signed)
Will you please let patient know that his thyroid supplement appears to be under dosed and I'd recommend he switch to a 112mcg formulation that I sent to [facility].  Also his three month average blood sugar is in the type 2 diabetes range.  This can be improved with engaging in 30-45 minutes of moderate exercise most days of the week and cutting back on carbohydrates in his diet.  This elevated blood sugar appears to be causing an elevation in his triglycerides and is causing some mild liver inflammation.  I'd recommend he f/u in three months to recheck this.  His PSA prostate test is rising and I'd recommend he visit with a urologist to discuss whether or not a prostate biopsy is needed, a referral will be placed today.

## 2015-11-11 NOTE — Telephone Encounter (Signed)
Results mailed 

## 2016-01-06 ENCOUNTER — Encounter: Payer: Self-pay | Admitting: Family Medicine

## 2016-01-06 DIAGNOSIS — R911 Solitary pulmonary nodule: Secondary | ICD-10-CM | POA: Insufficient documentation

## 2016-05-21 ENCOUNTER — Other Ambulatory Visit: Payer: Self-pay | Admitting: Family Medicine

## 2016-09-10 ENCOUNTER — Ambulatory Visit (INDEPENDENT_AMBULATORY_CARE_PROVIDER_SITE_OTHER): Payer: BLUE CROSS/BLUE SHIELD | Admitting: Osteopathic Medicine

## 2016-09-10 ENCOUNTER — Encounter: Payer: Self-pay | Admitting: Osteopathic Medicine

## 2016-09-10 ENCOUNTER — Other Ambulatory Visit: Payer: Self-pay | Admitting: Osteopathic Medicine

## 2016-09-10 VITALS — BP 119/80 | HR 76 | Wt 234.0 lb

## 2016-09-10 DIAGNOSIS — R972 Elevated prostate specific antigen [PSA]: Secondary | ICD-10-CM

## 2016-09-10 DIAGNOSIS — Z23 Encounter for immunization: Secondary | ICD-10-CM

## 2016-09-10 DIAGNOSIS — G4733 Obstructive sleep apnea (adult) (pediatric): Secondary | ICD-10-CM

## 2016-09-10 DIAGNOSIS — R7303 Prediabetes: Secondary | ICD-10-CM | POA: Diagnosis not present

## 2016-09-10 DIAGNOSIS — E039 Hypothyroidism, unspecified: Secondary | ICD-10-CM

## 2016-09-10 DIAGNOSIS — H6122 Impacted cerumen, left ear: Secondary | ICD-10-CM | POA: Diagnosis not present

## 2016-09-10 DIAGNOSIS — Z0000 Encounter for general adult medical examination without abnormal findings: Secondary | ICD-10-CM | POA: Diagnosis not present

## 2016-09-10 LAB — CBC WITH DIFFERENTIAL/PLATELET
Basophils Absolute: 0 cells/uL (ref 0–200)
Basophils Relative: 0 %
Eosinophils Absolute: 144 cells/uL (ref 15–500)
Eosinophils Relative: 2 %
HCT: 45.8 % (ref 38.5–50.0)
Hemoglobin: 15.2 g/dL (ref 13.2–17.1)
Lymphocytes Relative: 31 %
Lymphs Abs: 2232 cells/uL (ref 850–3900)
MCH: 31.7 pg (ref 27.0–33.0)
MCHC: 33.2 g/dL (ref 32.0–36.0)
MCV: 95.4 fL (ref 80.0–100.0)
MPV: 9.6 fL (ref 7.5–12.5)
Monocytes Absolute: 576 cells/uL (ref 200–950)
Monocytes Relative: 8 %
Neutro Abs: 4248 cells/uL (ref 1500–7800)
Neutrophils Relative %: 59 %
Platelets: 268 10*3/uL (ref 140–400)
RBC: 4.8 MIL/uL (ref 4.20–5.80)
RDW: 12.9 % (ref 11.0–15.0)
WBC: 7.2 10*3/uL (ref 3.8–10.8)

## 2016-09-10 LAB — COMPLETE METABOLIC PANEL WITH GFR
ALT: 68 U/L — ABNORMAL HIGH (ref 9–46)
AST: 96 U/L — ABNORMAL HIGH (ref 10–35)
Albumin: 3.8 g/dL (ref 3.6–5.1)
Alkaline Phosphatase: 91 U/L (ref 40–115)
BUN: 10 mg/dL (ref 7–25)
CO2: 24 mmol/L (ref 20–31)
Calcium: 9.4 mg/dL (ref 8.6–10.3)
Chloride: 100 mmol/L (ref 98–110)
Creat: 0.94 mg/dL (ref 0.70–1.33)
GFR, Est African American: >89 mL/min (ref >=60)
GFR, Est Non African American: 89 mL/min (ref >=60)
Glucose, Bld: 203 mg/dL — ABNORMAL HIGH (ref 65–99)
Potassium: 4.7 mmol/L (ref 3.5–5.3)
Sodium: 135 mmol/L (ref 135–146)
Total Bilirubin: 0.8 mg/dL (ref 0.2–1.2)
Total Protein: 7.5 g/dL (ref 6.1–8.1)

## 2016-09-10 LAB — LIPID PANEL
Cholesterol: 155 mg/dL (ref 125–200)
HDL: 50 mg/dL (ref >=40)
LDL Cholesterol: 62 mg/dL (ref <130)
Total CHOL/HDL Ratio: 3.1 Ratio (ref <=5.0)
Triglycerides: 213 mg/dL — ABNORMAL HIGH (ref <150)
VLDL: 43 mg/dL — ABNORMAL HIGH (ref <30)

## 2016-09-10 LAB — TSH: TSH: 5.76 mIU/L — ABNORMAL HIGH (ref 0.40–4.50)

## 2016-09-10 MED ORDER — LEVOTHYROXINE SODIUM 112 MCG PO TABS: 112.0000 ug | ORAL_TABLET | Freq: Every day | ORAL | 0 refills | Status: DC

## 2016-09-10 NOTE — Progress Notes (Signed)
HPI: Paul Reed is a 60 y.o. male  who presents to [facility] today, 09/10/16,  for chief complaint of:  Chief Complaint  Patient presents with  . Annual Exam    pt fasting  . Tinnitus    left ear    Last annual physical 10/2015 Reviewed Dr Genelle Bal notes: "Colonoscopy: 2010 without any findings or polyps Prostate: Discussed screening risks/beneifts with patient today, will check PSA Influenza Vaccine: will receive today Pneumovax: No current indication, no formal diagnosis of type 2 diabetes Td/Tdap: 2009 got Td up-to-date Zoster: (Start 60 yo)"  PSA abnormal - was sent to [facility] for biopsy but this had to be postponed.   Only complaint today is occasional ringing in the left ear. He has previously had to get earwax flushed out, requests this again today.   Past medical, surgical, social and family history reviewed: Past Medical History:  Diagnosis Date  . Bursitis   . Chronic kidney disease    stones  . ELEVATED PROSTATE SPECIFIC ANTIGEN 10/23/2008   Biopsy planned with Dr. Sherryl Barters ([facility])    . Hypothyroidism   . Impaired fasting glucose   . Obesity   . OSA on CPAP    13 years   Past Surgical History:  Procedure Laterality Date  . ESOPHAGUS SURGERY     Removed glass in infancy   Social History  Substance Use Topics  . Smoking status: Never Smoker  . Smokeless tobacco: Never Used  . Alcohol use Yes     Comment: very infrequent   Family History  Problem Relation Age of Onset  . Cancer Father   . Thyroid disease      mother     Current medication list and allergy/intolerance information reviewed:   Current Outpatient Prescriptions  Medication Sig Dispense Refill  . levothyroxine (SYNTHROID, LEVOTHROID) 112 MCG tablet Take 1 tablet (112 mcg total) by mouth daily. NEED FOLLOW UP APPOINTMENT FOR MORE REFILLS 30 tablet 0   No current facility-administered medications for this visit.    No Known  Allergies    Review of Systems:  Constitutional:  No  fever, no chills, No recent illness, No unintentional weight changes. +significant fatigue.   HEENT: No  headache, no vision change, +hearing change, No sore throat, No  sinus pressure  Cardiac: No  chest pain, No  pressure, No palpitations, No  Orthopnea  Respiratory:  No  shortness of breath. No  Cough  Gastrointestinal: No  abdominal pain, No  nausea, No  vomiting,  No  blood in stool, No  diarrhea, No  constipation   Musculoskeletal: No new myalgia/arthralgia  Skin: No  Rash, No other wounds/concerning lesions  Endocrine: No cold intolerance,  No heat intolerance.  Neurologic: No  weakness, No  dizziness  Psychiatric: No  concerns with depression, No  concerns with anxiety  Exam:  BP 119/80   Pulse 76   Wt 234 lb (106.1 kg)   BMI 35.58 kg/m   Constitutional: VS see above. General Appearance: alert, well-developed, well-nourished, NAD  Eyes: Normal lids and conjunctive, non-icteric sclera  Ears, Nose, Mouth, Throat: MMM, Normal external inspection ears/nares/mouth/lips/gums. On left-sided obscured by cerumen, cerumen still present even after flushing performed by nurse. Right TM normal. arynx/tonsils no erythema, no exudate. Nasal mucosa normal.   Neck: No masses, trachea midline. +thyroid enlargement. No tenderness/mass appreciated. No lymphadenopathy  Respiratory: Normal respiratory effort. no wheeze, no rhonchi, no rales  Cardiovascular: S1/S2 normal, no murmur, no rub/gallop  auscultated. RRR. No lower extremity edema.   Gastrointestinal: Nontender, no masses. No hepatomegaly, no splenomegaly. No hernia appreciated. Bowel sounds normal. Rectal exam deferred.   Musculoskeletal: Gait normal. No clubbing/cyanosis of digits.   Neurological: Cerebellar reflexes grossly intact. Normal balance/coordination. No tremor.   Skin: warm, dry, intact. No rash/ulcer.   Psychiatric: Normal judgment/insight. Normal mood  and affect. Oriented x3.     ASSESSMENT/PLAN:   Annual physical exam - Plan: CBC with Differential/Platelet, COMPLETE METABOLIC PANEL WITH GFR, Hepatitis C antibody, HIV antibody, Lipid panel, VITAMIN D 25 Hydroxy (Vit-D Deficiency, Fractures)  Elevated PSA - Plan: PSA, total and free  Cerumen impaction, left - Declined insulin mentation removal of cerumen by myself, referred to ENT - Plan: Ambulatory referral to ENT  Hypothyroidism, unspecified hypothyroidism type - Plan: levothyroxine (SYNTHROID, LEVOTHROID) 112 MCG tablet, TSH  Prediabetes - Plan: Hemoglobin A1c  Needs flu shot - Plan: Flu Vaccine QUAD 36+ mos IM  OSA (obstructive sleep apnea) - Same CPAP settings and no sleep study in about 15 years, consider repeat    MALE PREVENTIVE CARE updated 09/10/16  ANNUAL SCREENING/COUNSELING  Any changes to health in the past year? No other than elevated PSA, lost to followup w/ urology. Needs CPAP/sleep study updated, has been several years  Tobacco - no   Alcohol - social drinker  Diet/Exercise - Healthy habits discussed to decrease CV risk and promote overall health. Patient does not have dietary restrictions.   Depression - PQH2 Negative  Feel safe at home? - yes  HTN SCREENING - SEE VITALS  SEXUAL/REPRODUCTIVE HEALTH  Sexually active? - Yes with male.  STI testing needed/desired today? - No  Any concerns with testosterone/libido? - no  INFECTIOUS DISEASE SCREENING  HIV - does not need  GC/CT - does not need  HepC - does not need  TB - does not need  CANCER SCREENING  Lung - does not need  Colon - does not need  Prostate - needs  OTHER DISEASE SCREENING  Lipid - needs  DM2 - needs  Osteoporosis - does not need  ADULT VACCINATION  Influenza - needs today, annual vaccine recommended  Td - was not indicated  Zoster - was not indicated  Pneumonia - was not indicated     Visit summary with medication list and pertinent instructions  was printed for patient to review. All questions at time of visit were answered - patient instructed to contact office with any additional concerns. ER/RTC precautions were reviewed with the patient. Follow-up plan: Return in about 1 year (around 09/10/2017), or sooner if needed / based on labs, for [facility].

## 2016-09-11 ENCOUNTER — Encounter: Payer: Self-pay | Admitting: Osteopathic Medicine

## 2016-09-11 DIAGNOSIS — Z0000 Encounter for general adult medical examination without abnormal findings: Secondary | ICD-10-CM | POA: Insufficient documentation

## 2016-09-11 LAB — HEMOGLOBIN A1C
Hgb A1c MFr Bld: 10 % — ABNORMAL HIGH (ref <5.7)
Mean Plasma Glucose: 240 mg/dL

## 2016-09-11 LAB — VITAMIN D 25 HYDROXY (VIT D DEFICIENCY, FRACTURES): Vit D, 25-Hydroxy: 14 ng/mL — ABNORMAL LOW (ref 30–100)

## 2016-09-11 LAB — HIV ANTIBODY (ROUTINE TESTING W REFLEX): HIV 1&2 Ab, 4th Generation: NONREACTIVE

## 2016-09-11 LAB — HEPATITIS C ANTIBODY: HCV Ab: NEGATIVE

## 2016-09-11 NOTE — Assessment & Plan Note (Signed)
MALE PREVENTIVE CARE updated 09/10/16  ANNUAL SCREENING/COUNSELING  Any changes to health in the past year? No other than elevated PSA, lost to followup w/ urology. Needs CPAP/sleep study updated, has been several years  Tobacco - no   Alcohol - social drinker  Diet/Exercise - Healthy habits discussed to decrease CV risk and promote overall health. Patient does not have dietary restrictions.   Depression - PQH2 Negative  Feel safe at home? - yes  HTN SCREENING - SEE VITALS  SEXUAL/REPRODUCTIVE HEALTH  Sexually active? - Yes with male.  STI testing needed/desired today? - No  Any concerns with testosterone/libido? - no  INFECTIOUS DISEASE SCREENING  HIV - does not need  GC/CT - does not need  HepC - does not need  TB - does not need  CANCER SCREENING  Lung - does not need  Colon - does not need  Prostate - needs  OTHER DISEASE SCREENING  Lipid - needs  DM2 - needs  Osteoporosis - does not need  ADULT VACCINATION  Influenza - needs today, annual vaccine recommended  Td - was not indicated  Zoster - was not indicated  Pneumonia - was not indicated

## 2016-09-13 LAB — PSA, TOTAL AND FREE
PSA, % Free: 25 % — ABNORMAL LOW (ref >25)
PSA, Free: 1.3 ng/mL
PSA, Total: 5.3 ng/mL — ABNORMAL HIGH (ref <=4.0)

## 2016-10-15 ENCOUNTER — Other Ambulatory Visit: Payer: Self-pay | Admitting: Osteopathic Medicine

## 2016-10-15 DIAGNOSIS — E039 Hypothyroidism, unspecified: Secondary | ICD-10-CM

## 2017-04-19 DIAGNOSIS — H6062 Unspecified chronic otitis externa, left ear: Secondary | ICD-10-CM | POA: Diagnosis not present

## 2017-04-19 DIAGNOSIS — H9312 Tinnitus, left ear: Secondary | ICD-10-CM | POA: Diagnosis not present

## 2017-04-19 DIAGNOSIS — G4733 Obstructive sleep apnea (adult) (pediatric): Secondary | ICD-10-CM | POA: Diagnosis not present

## 2017-04-19 DIAGNOSIS — H6123 Impacted cerumen, bilateral: Secondary | ICD-10-CM | POA: Diagnosis not present

## 2017-04-19 DIAGNOSIS — H903 Sensorineural hearing loss, bilateral: Secondary | ICD-10-CM | POA: Diagnosis not present

## 2017-04-21 ENCOUNTER — Other Ambulatory Visit: Payer: Self-pay | Admitting: Osteopathic Medicine

## 2017-04-21 DIAGNOSIS — E039 Hypothyroidism, unspecified: Secondary | ICD-10-CM

## 2017-09-15 ENCOUNTER — Ambulatory Visit (INDEPENDENT_AMBULATORY_CARE_PROVIDER_SITE_OTHER): Payer: BLUE CROSS/BLUE SHIELD | Admitting: Osteopathic Medicine

## 2017-09-15 ENCOUNTER — Encounter: Payer: Self-pay | Admitting: Osteopathic Medicine

## 2017-09-15 VITALS — BP 124/70 | HR 70 | Ht 67.0 in | Wt 221.0 lb

## 2017-09-15 DIAGNOSIS — Z23 Encounter for immunization: Secondary | ICD-10-CM | POA: Diagnosis not present

## 2017-09-15 DIAGNOSIS — H1032 Unspecified acute conjunctivitis, left eye: Secondary | ICD-10-CM | POA: Diagnosis not present

## 2017-09-15 MED ORDER — ERYTHROMYCIN 5 MG/GM OP OINT: 1.0000 "application " | TOPICAL_OINTMENT | Freq: Three times a day (TID) | OPHTHALMIC | 0 refills | Status: AC

## 2017-09-15 NOTE — Progress Notes (Signed)
HPI: Paul Reed is a 61 y.o. male  who presents to [facility] today, 09/15/17,  for chief complaint of:  Chief Complaint  Patient presents with  . Eye Pain    LEFT    Eye concern: Significant redness and itching of the left eye for about 4 days now. Has not affected vision. Has noticed some increased crusting drainage but no purulent discharge. No pressure/headache, feels like maybe a bit swollen around the skin of the eye. No fever/chills or upper respiratory symptoms. .   Past medical, surgical, social and family history reviewed: Patient Active Problem List   Diagnosis Date Noted  . Annual physical exam 09/11/2016  . Solitary pulmonary nodule 01/06/2016  . Type 2 diabetes mellitus (HCC) 10/29/2015  . Hyperglycemia 10/28/2015  . Hypertriglyceridemia 02/08/2013  . CALF PAIN, LEFT 10/30/2010  . ELEVATED PROSTATE SPECIFIC ANTIGEN 10/23/2008  . HYPOTHYROIDISM 06/01/2007  . OBESITY NOS 06/01/2007  . NEPHROLITHIASIS 06/01/2007   Past Surgical History:  Procedure Laterality Date  . ESOPHAGUS SURGERY     Removed glass in infancy   Social History  Substance Use Topics  . Smoking status: Never Smoker  . Smokeless tobacco: Never Used  . Alcohol use Yes     Comment: very infrequent   Family History  Problem Relation Age of Onset  . Cancer Father   . Thyroid disease Unknown        mother     Current medication list and allergy/intolerance information reviewed:   Current Outpatient Prescriptions  Medication Sig Dispense Refill  . levothyroxine (SYNTHROID, LEVOTHROID) 112 MCG tablet take 1 tablet by mouth once daily before BREAKFAST 90 tablet 1   No current facility-administered medications for this visit.    No Known Allergies    Review of Systems:  Constitutional:  No  fever, no chills, No recent illness, No unintentional weight changes. No significant fatigue.   HEENT: No  headache, no vision change, no hearing change, No sore  throat, No  sinus pressure  Cardiac: No  chest pain, No  pressure  Respiratory:  No  shortness of breath. No  Cough  Gastrointestinal: No  abdominal pain, No  nausea, No  vomiting  Neurologic: No  weakness, No  dizziness,   Exam:  BP 124/70   Pulse 70   Ht  (1.702 m)   Wt 221 lb (100.2 kg)   BMI 34.61 kg/m   Constitutional: VS see above. General Appearance: alert, well-developed, well-nourished, NAD  Eyes: Normal lids, non-icteric sclera. Significant conjunctival injection on left throughout the eye. No visible drainage. Extraocular movements intact bilaterally, PERRLA. Limited funduscopic nondilated exam on left shows normal anatomy. Exam under fluoroscopy seen staining shows no abnormality.  Ears, Nose, Mouth, Throat: MMM, Normal external inspection ears/nares/mouth/lips/gums.   Neck: No masses, trachea midline.  Respiratory: Normal respiratory effort. Neurological: Normal balance/coordination. No tremor. No cranial nerve deficit on limited exam. Motor grossly intact and symmetric.   Skin: warm, dry, intact. No rash/ulcer.  Vision screening: Right eye 20/30 Left eye 20/40 Both eyes 20/25    ASSESSMENT/PLAN:   Acute conjunctivitis of left eye, unspecified acute conjunctivitis type - Plan: erythromycin ophthalmic ointment  Need for immunization against influenza - Plan: Flu Vaccine QUAD 36+ mos IM    Patient Instructions  Plan:  We'll try antibiotic eye ointment to prevent secondary infection and to help soothe the eye. There is a thin layer of tissue over that I called the conjunctiva. I believe this is  irritated, whether due to virus, allergen, or other irritant.   If anything changes or gets worse, seek medical attention right away, especially if you experience vision changes. Otherwise, if no better by Monday, would recommend follow-up with your ophthalmologist / optometrist or we can place a referral for you if needed    Visit summary with medication  list and pertinent instructions was printed for patient to review. All questions at time of visit were answered - patient instructed to contact office with any additional concerns. ER/RTC precautions were reviewed with the patient. Follow-up plan: Return for If symptoms worsen/fail to improve please contact us or eye doctor, otherwise when due for annual.

## 2017-09-15 NOTE — Patient Instructions (Signed)
Plan:  We'll try antibiotic eye ointment to prevent secondary infection and to help soothe the eye. There is a thin layer of tissue over that I called the conjunctiva. I believe this is irritated, whether due to virus, allergen, or other irritant.   If anything changes or gets worse, seek medical attention right away, especially if you experience vision changes. Otherwise, if no better by Monday, would recommend follow-up with your ophthalmologist / optometrist or we can place a referral for you if needed

## 2017-10-16 LAB — HM DIABETES EYE EXAM

## 2017-10-25 ENCOUNTER — Other Ambulatory Visit: Payer: Self-pay | Admitting: Osteopathic Medicine

## 2017-10-25 DIAGNOSIS — E039 Hypothyroidism, unspecified: Secondary | ICD-10-CM

## 2017-11-16 ENCOUNTER — Encounter: Payer: Self-pay | Admitting: Osteopathic Medicine

## 2017-11-16 ENCOUNTER — Ambulatory Visit (INDEPENDENT_AMBULATORY_CARE_PROVIDER_SITE_OTHER): Payer: BLUE CROSS/BLUE SHIELD | Admitting: Osteopathic Medicine

## 2017-11-16 VITALS — BP 114/75 | HR 78 | Temp 98.2°F | Resp 16 | Ht 67.0 in | Wt 221.0 lb

## 2017-11-16 DIAGNOSIS — E785 Hyperlipidemia, unspecified: Secondary | ICD-10-CM | POA: Diagnosis not present

## 2017-11-16 DIAGNOSIS — E039 Hypothyroidism, unspecified: Secondary | ICD-10-CM | POA: Diagnosis not present

## 2017-11-16 DIAGNOSIS — R972 Elevated prostate specific antigen [PSA]: Secondary | ICD-10-CM

## 2017-11-16 DIAGNOSIS — E1169 Type 2 diabetes mellitus with other specified complication: Secondary | ICD-10-CM | POA: Diagnosis not present

## 2017-11-16 DIAGNOSIS — E119 Type 2 diabetes mellitus without complications: Secondary | ICD-10-CM | POA: Diagnosis not present

## 2017-11-16 DIAGNOSIS — R748 Abnormal levels of other serum enzymes: Secondary | ICD-10-CM | POA: Diagnosis not present

## 2017-11-16 DIAGNOSIS — E559 Vitamin D deficiency, unspecified: Secondary | ICD-10-CM | POA: Insufficient documentation

## 2017-11-16 LAB — HM DIABETES EYE EXAM

## 2017-11-16 LAB — POCT GLYCOSYLATED HEMOGLOBIN (HGB A1C): Hemoglobin A1C: 11.1

## 2017-11-16 MED ORDER — METFORMIN HCL ER (MOD) 1000 MG PO TB24: 1000.0000 mg | ORAL_TABLET | Freq: Two times a day (BID) | ORAL | 1 refills | Status: DC

## 2017-11-16 MED ORDER — BLOOD GLUCOSE MONITOR KIT: PACK | 99 refills | Status: DC

## 2017-11-16 MED ORDER — EMPAGLIFLOZIN 25 MG PO TABS: 25.0000 mg | ORAL_TABLET | Freq: Every day | ORAL | 1 refills | Status: DC

## 2017-11-16 NOTE — Patient Instructions (Signed)
Diabetes Mellitus and Food It is important for you to manage your blood sugar (glucose) level. Your blood glucose level can be greatly affected by what you eat. Eating healthier foods in the appropriate amounts throughout the day at about the same time each day will help you control your blood glucose level. It can also help slow or prevent worsening of your diabetes mellitus. Healthy eating may even help you improve the level of your blood pressure and reach or maintain a healthy weight. General recommendations for healthful eating and cooking habits include:  Eating meals and snacks regularly. Avoid going long periods of time without eating to lose weight.  Eating a diet that consists mainly of plant-based foods, such as fruits, vegetables, nuts, legumes, and whole grains.  Using low-heat cooking methods, such as baking, instead of high-heat cooking methods, such as deep frying.  Work with your dietitian to make sure you understand how to use the Nutrition Facts information on food labels. How can food affect me? Carbohydrates Carbohydrates affect your blood glucose level more than any other type of food. Your dietitian will help you determine how many carbohydrates to eat at each meal and teach you how to count carbohydrates. Counting carbohydrates is important to keep your blood glucose at a healthy level, especially if you are using insulin or taking certain medicines for diabetes mellitus. Alcohol Alcohol can cause sudden decreases in blood glucose (hypoglycemia), especially if you use insulin or take certain medicines for diabetes mellitus. Hypoglycemia can be a life-threatening condition. Symptoms of hypoglycemia (sleepiness, dizziness, and disorientation) are similar to symptoms of having too much alcohol. If your health care provider has given you approval to drink alcohol, do so in moderation and use the following guidelines:  Women should not have more than one drink per day, and men  should not have more than two drinks per day. One drink is equal to: ? 12 oz of beer. ? 5 oz of wine. ? 1 oz of hard liquor.  Do not drink on an empty stomach.  Keep yourself hydrated. Have water, diet soda, or unsweetened iced tea.  Regular soda, juice, and other mixers might contain a lot of carbohydrates and should be counted.  What foods are not recommended? As you make food choices, it is important to remember that all foods are not the same. Some foods have fewer nutrients per serving than other foods, even though they might have the same number of calories or carbohydrates. It is difficult to get your body what it needs when you eat foods with fewer nutrients. Examples of foods that you should avoid that are high in calories and carbohydrates but low in nutrients include:  Trans fats (most processed foods list trans fats on the Nutrition Facts label).  Regular soda.  Juice.  Candy.  Sweets, such as cake, pie, doughnuts, and cookies.  Fried foods.  What foods can I eat? Eat nutrient-rich foods, which will nourish your body and keep you healthy. The food you should eat also will depend on several factors, including:  The calories you need.  The medicines you take.  Your weight.  Your blood glucose level.  Your blood pressure level.  Your cholesterol level.  You should eat a variety of foods, including:  Protein. ? Lean cuts of meat. ? Proteins low in saturated fats, such as fish, egg whites, and beans. Avoid processed meats.  Fruits and vegetables. ? Fruits and vegetables that may help control blood glucose levels, such as apples,   mangoes, and yams.  Dairy products. ? Choose fat-free or low-fat dairy products, such as milk, yogurt, and cheese.  Grains, bread, pasta, and rice. ? Choose whole grain products, such as multigrain bread, whole oats, and brown rice. These foods may help control blood pressure.  Fats. ? Foods containing healthful fats, such as  nuts, avocado, olive oil, canola oil, and fish.  Does everyone with diabetes mellitus have the same meal plan? Because every person with diabetes mellitus is different, there is not one meal plan that works for everyone. It is very important that you meet with a dietitian who will help you create a meal plan that is just right for you. This information is not intended to replace advice given to you by your health care provider. Make sure you discuss any questions you have with your health care provider. Document Released: 09/02/2005 Document Revised: 05/13/2016 Document Reviewed: 11/02/2013 Elsevier Interactive Patient Education  2017 Elsevier Inc.  

## 2017-11-16 NOTE — Addendum Note (Signed)
Addended by: Lavell IslamKLAERS, Atisha Hamidi A on: 11/16/2017 11:32 AM   Modules accepted: Orders

## 2017-11-16 NOTE — Progress Notes (Signed)
HPI: Paul Reed is a 62 y.o. male who  has a past medical history of Bursitis, Chronic kidney disease, ELEVATED PROSTATE SPECIFIC ANTIGEN (10/23/2008), Hypothyroidism, Impaired fasting glucose, Obesity, and OSA on CPAP.  he presents to [facility] today, 11/16/17,  for chief complaint of: labs follow up    Patient was initially on the schedule for a physical. On review of records, he had a very high A1c last year and few other lab abnormalities, but never followed up to address this - see lab result note, patient was called about the results, per the note, CMA apparently spoke to him but it doesn't look like an appointment was ever scheduled. His only medication at this point is levothyroxine.  Diabetes: as above, did not follow up for discussion of this. A1C today. He reports he has not been careful about diet, not participating in any exercise program, "less carbs"  Elevated liver enzymes: see labs. EtOH is not a problem for him  Hyperlipidemia: no meds, diet as noted above, he is not on any particular dietary restrictions and diet is overall unhealthy  Hypothyroid: last TSH a bit out of range   Vit D def: quite low last check, no OTC supplements     Past medical, surgical, social and family history reviewed:  Patient Active Problem List   Diagnosis Date Noted  . Annual physical exam 09/11/2016  . Solitary pulmonary nodule 01/06/2016  . Type 2 diabetes mellitus ([place]) 10/29/2015  . Hyperglycemia 10/28/2015  . Hypertriglyceridemia 02/08/2013  . CALF PAIN, LEFT 10/30/2010  . ELEVATED PROSTATE SPECIFIC ANTIGEN 10/23/2008  . HYPOTHYROIDISM 06/01/2007  . OBESITY NOS 06/01/2007  . NEPHROLITHIASIS 06/01/2007    Past Surgical History:  Procedure Laterality Date  . ESOPHAGUS SURGERY     Removed glass in infancy    Social History   Tobacco Use  . Smoking status: Never Smoker  . Smokeless tobacco: Never Used  Substance Use Topics  .  Alcohol use: Yes    Comment: very infrequent    Family History  Problem Relation Age of Onset  . Cancer Father   . Thyroid disease Unknown        mother     Current medication list and allergy/intolerance information reviewed:    Current Outpatient Medications  Medication Sig Dispense Refill  . levothyroxine (SYNTHROID, LEVOTHROID) 112 MCG tablet Take 1 tablet (112 mcg total) daily before breakfast by mouth. Due for follow up visit 30 tablet 0   No current facility-administered medications for this visit.     No Known Allergies    Review of Systems:  Constitutional:  No  fever, no chills, No recent illness,  Cardiac: No  chest pain, No  pressure  Respiratory:  No  shortness of breath. No  Cough  Skin: No  Rash,  Neurologic: No  weakness, No  dizziness  Psychiatric: No  concerns with depression, No  concerns with anxiety  Exam:  BP 114/75 (BP Location: Right Arm, Patient Position: Sitting, Cuff Size: Large)   Pulse 78   Temp 98.2 F (36.8 C)   Resp 16   Ht 5' 7" (1.702 m)   Wt 221 lb (100.2 kg)   SpO2 97%   BMI 34.61 kg/m   Constitutional: VS see above. General Appearance: alert, well-developed, well-nourished, NAD  Eyes: Normal lids and conjunctive, non-icteric sclera  Ears, Nose, Mouth, Throat: MMM, Normal external inspection ears/nares/mouth/lips/gums.   Neck: No masses, trachea midline. No thyroid enlargement.   Respiratory:  Normal respiratory effort. no wheeze, no rhonchi, no rales  Cardiovascular: S1/S2 normal, no murmur, no rub/gallop auscultated. RRR.   Musculoskeletal: Gait normal.   Neurological: Normal balance/coordination. No tremor.     Psychiatric: Normal judgment/insight. Normal mood and affect. Oriented x3.      ASSESSMENT/PLAN:   Type 2 diabetes mellitus without complication, without long-term current use of insulin (HCC) - Extensive discussion on natural history, complications of, management of diabetes. Aggressive lifestyle  changes, patient would like to avoid injections. Likely will need triple therapy, we'll go ahead and start dual therapy today and have him start measuring home blood sugars. I suspect we are likely going to need insulin but he may surprise [place] if A1c improves with lifestyle modifications and oral medicines. - Plan: metFORMIN (GLUMETZA) 1000 MG (MOD) 24 hr tablet, empagliflozin (JARDIANCE) 25 MG TABS tablet, CBC, COMPLETE METABOLIC PANEL WITH GFR, Lipid panel, blood glucose meter kit and supplies KIT  Elevated PSA - Plan: PSA, Total with Reflex to PSA, Free  Elevated liver enzymes - Plan: COMPLETE METABOLIC PANEL WITH GFR  Hyperlipidemia associated with type 2 diabetes mellitus ([place]) - Plan: Lipid panel  Hypothyroidism, unspecified type - Plan: TSH  Vitamin D deficiency - Plan: VITAMIN D 25 Hydroxy (Vit-D Deficiency, Fractures)       Visit summary with medication list and pertinent instructions was printed for patient to review. All questions at time of visit were answered - patient instructed to contact office with any additional concerns. ER/RTC precautions were reviewed with the patient. Follow-up plan: Return in about 4 weeks (around 12/14/2017) for review blood sugars and medicatoins, labs.  Note: Total time spent 40 minutes, greater than 50% of the visit was spent face-to-face counseling and coordinating care for the following: The primary encounter diagnosis was Type 2 diabetes mellitus without complication, without long-term current use of insulin ([place]). Diagnoses of Elevated PSA, Elevated liver enzymes, Hyperlipidemia associated with type 2 diabetes mellitus ([place]), Hypothyroidism, unspecified type, and Vitamin D deficiency were also pertinent to this visit.Marland Kitchen  Please note: voice recognition software was used to produce this document, and typos may escape review. Please contact Dr. Sheppard Coil for any needed clarifications.

## 2017-11-17 ENCOUNTER — Telehealth: Payer: Self-pay | Admitting: *Deleted

## 2017-11-17 LAB — COMPLETE METABOLIC PANEL WITH GFR
AG Ratio: 1.1 (calc) (ref 1.0–2.5)
ALT: 50 U/L — ABNORMAL HIGH (ref 9–46)
AST: 46 U/L — ABNORMAL HIGH (ref 10–35)
Albumin: 4.4 g/dL (ref 3.6–5.1)
Alkaline phosphatase (APISO): 99 U/L (ref 40–115)
BUN: 11 mg/dL (ref 7–25)
CO2: 25 mmol/L (ref 20–32)
Calcium: 9.8 mg/dL (ref 8.6–10.3)
Chloride: 99 mmol/L (ref 98–110)
Creat: 0.94 mg/dL (ref 0.70–1.33)
GFR, Est African American: 102 mL/min/{1.73_m2} (ref >=60)
GFR, Est Non African American: 88 mL/min/{1.73_m2} (ref >=60)
Globulin: 3.9 g/dL (calc) — ABNORMAL HIGH (ref 1.9–3.7)
Glucose, Bld: 234 mg/dL — ABNORMAL HIGH (ref 65–99)
Potassium: 4.3 mmol/L (ref 3.5–5.3)
Sodium: 135 mmol/L (ref 135–146)
Total Bilirubin: 0.9 mg/dL (ref 0.2–1.2)
Total Protein: 8.3 g/dL — ABNORMAL HIGH (ref 6.1–8.1)

## 2017-11-17 LAB — CBC
HCT: 47.9 % (ref 38.5–50.0)
Hemoglobin: 16.3 g/dL (ref 13.2–17.1)
MCH: 31.2 pg (ref 27.0–33.0)
MCHC: 34 g/dL (ref 32.0–36.0)
MCV: 91.6 fL (ref 80.0–100.0)
MPV: 10.3 fL (ref 7.5–12.5)
Platelets: 319 10*3/uL (ref 140–400)
RBC: 5.23 10*6/uL (ref 4.20–5.80)
RDW: 11.8 % (ref 11.0–15.0)
WBC: 8.3 10*3/uL (ref 3.8–10.8)

## 2017-11-17 LAB — PSA, TOTAL WITH REFLEX TO PSA, FREE: PSA, Total: 4.7 ng/mL — ABNORMAL HIGH (ref <=4.0)

## 2017-11-17 LAB — LIPID PANEL
Cholesterol: 189 mg/dL (ref <200)
HDL: 53 mg/dL (ref >40)
LDL Cholesterol (Calc): 96 mg/dL (calc)
Non-HDL Cholesterol (Calc): 136 mg/dL (calc) — ABNORMAL HIGH (ref <130)
Total CHOL/HDL Ratio: 3.6 (calc) (ref <5.0)
Triglycerides: 280 mg/dL — ABNORMAL HIGH (ref <150)

## 2017-11-17 LAB — REFLEX PSA, FREE
PSA, % Free: 21 % (calc) — ABNORMAL LOW (ref >25)
PSA, Free: 1 ng/mL

## 2017-11-17 LAB — TSH: TSH: 3.15 mIU/L (ref 0.40–4.50)

## 2017-11-17 LAB — VITAMIN D 25 HYDROXY (VIT D DEFICIENCY, FRACTURES): Vit D, 25-Hydroxy: 13 ng/mL — ABNORMAL LOW (ref 30–100)

## 2017-11-17 NOTE — Telephone Encounter (Signed)
Pre Authorization sent to cover my meds. [id number]

## 2017-11-18 ENCOUNTER — Other Ambulatory Visit: Payer: Self-pay | Admitting: Osteopathic Medicine

## 2017-11-18 ENCOUNTER — Telehealth: Payer: Self-pay | Admitting: Osteopathic Medicine

## 2017-11-18 DIAGNOSIS — R972 Elevated prostate specific antigen [PSA]: Secondary | ICD-10-CM

## 2017-11-18 DIAGNOSIS — E119 Type 2 diabetes mellitus without complications: Secondary | ICD-10-CM

## 2017-11-18 DIAGNOSIS — E559 Vitamin D deficiency, unspecified: Secondary | ICD-10-CM

## 2017-11-18 DIAGNOSIS — R748 Abnormal levels of other serum enzymes: Secondary | ICD-10-CM

## 2017-11-18 MED ORDER — VITAMIN D (ERGOCALCIFEROL) 1.25 MG (50000 UNIT) PO CAPS: 50000.0000 [IU] | ORAL_CAPSULE | ORAL | 0 refills | Status: DC

## 2017-11-18 MED ORDER — ATORVASTATIN CALCIUM 20 MG PO TABS: 40.0000 mg | ORAL_TABLET | Freq: Every day | ORAL | 1 refills | Status: DC

## 2017-11-18 MED ORDER — METFORMIN HCL 1000 MG PO TABS: 1000.0000 mg | ORAL_TABLET | Freq: Two times a day (BID) | ORAL | 3 refills | Status: DC

## 2017-11-18 NOTE — Progress Notes (Signed)
Orders add on

## 2017-11-18 NOTE — Telephone Encounter (Signed)
Extended release metformin was not approved

## 2017-11-22 ENCOUNTER — Other Ambulatory Visit: Payer: Self-pay | Admitting: Osteopathic Medicine

## 2017-11-22 DIAGNOSIS — E039 Hypothyroidism, unspecified: Secondary | ICD-10-CM

## 2017-11-23 ENCOUNTER — Encounter: Payer: Self-pay | Admitting: Osteopathic Medicine

## 2017-12-15 ENCOUNTER — Ambulatory Visit: Payer: BLUE CROSS/BLUE SHIELD | Admitting: Osteopathic Medicine

## 2017-12-15 ENCOUNTER — Encounter: Payer: Self-pay | Admitting: Osteopathic Medicine

## 2017-12-15 VITALS — BP 111/71 | HR 72 | Wt 214.0 lb

## 2017-12-15 DIAGNOSIS — R972 Elevated prostate specific antigen [PSA]: Secondary | ICD-10-CM

## 2017-12-15 DIAGNOSIS — E559 Vitamin D deficiency, unspecified: Secondary | ICD-10-CM | POA: Diagnosis not present

## 2017-12-15 DIAGNOSIS — R748 Abnormal levels of other serum enzymes: Secondary | ICD-10-CM

## 2017-12-15 DIAGNOSIS — E119 Type 2 diabetes mellitus without complications: Secondary | ICD-10-CM

## 2017-12-15 MED ORDER — ATORVASTATIN CALCIUM 40 MG PO TABS: 40.0000 mg | ORAL_TABLET | Freq: Every day | ORAL | 3 refills | Status: DC

## 2017-12-15 NOTE — Progress Notes (Signed)
HPI: Paul Reed is a 61 y.o. male who  has a past medical history of Bursitis, Chronic kidney disease, ELEVATED PROSTATE SPECIFIC ANTIGEN (10/23/2008), Hypothyroidism, Impaired fasting glucose, Obesity, and OSA on CPAP.  he presents to [facility] today, 12/15/17,  for chief complaint of: labs follow up, see how he is tolerating new anti-hyperglycemic medications    A1C last visit 11.1, he had previously had high levels but never followed up to address this. Metformin and Jardiance were Rx'ed last visit. He reports he was not careful about his diet in the past but recently has been reading nutrition labels, avoiding carbohydrates.  Elevated liver enzymes: see labs. EtOH is not a problem for him. Enzymes a little better on recheck last month.   Other lab abnormalities last visit: Protein totals a bit above normal, globulin levels a bit above normal. Gap is okay.  Hyperlipidemia: no meds, diet as noted above, he is not on any particular dietary restrictions and diet is overall unhealthy but working to improve with diet/exercise. Atorvastatin 40 mg is on his medication list that he is only taking 20 mg.  Hypothyroid: most recent TSH normal range.   Vit D def: quite low last check, Rx supplement was started.   PSA was high last month also at 4.7, history of prostate biopsy several years ago which was negative, PSAs have been actually trending down overall since then    Past medical, surgical, social and family history reviewed:  Patient Active Problem List   Diagnosis Date Noted  . Vitamin D deficiency 11/16/2017  . Hyperlipidemia associated with type 2 diabetes mellitus ([place]) 11/16/2017  . Elevated liver enzymes 11/16/2017  . Annual physical exam 09/11/2016  . Solitary pulmonary nodule 01/06/2016  . Type 2 diabetes mellitus ([place]) 10/29/2015  . Hyperglycemia 10/28/2015  . Hypertriglyceridemia 02/08/2013  . CALF PAIN, LEFT 10/30/2010  . Elevated  PSA 10/23/2008  . Hypothyroidism 06/01/2007  . OBESITY NOS 06/01/2007  . NEPHROLITHIASIS 06/01/2007    Past Surgical History:  Procedure Laterality Date  . ESOPHAGUS SURGERY     Removed glass in infancy    Social History   Tobacco Use  . Smoking status: Never Smoker  . Smokeless tobacco: Never Used  Substance Use Topics  . Alcohol use: Yes    Comment: very infrequent    Family History  Problem Relation Age of Onset  . Cancer Father   . Thyroid disease Unknown        mother     Current medication list and allergy/intolerance information reviewed:    Current Outpatient Medications  Medication Sig Dispense Refill  . atorvastatin (LIPITOR) 20 MG tablet Take 2 tablets (40 mg total) by mouth daily. 90 tablet 1  . blood glucose meter kit and supplies KIT Dispense based on patient and insurance preference. Use up to four times daily as directed. Please include lancets, test strips, control solution. 1 each prn  . empagliflozin (JARDIANCE) 25 MG TABS tablet Take 25 mg by mouth daily. 30 tablet 1  . levothyroxine (SYNTHROID, LEVOTHROID) 112 MCG tablet TAKE 1 TABLET(112 MCG) BY MOUTH DAILY BEFORE BREAKFAST. FOLLOW UP VISIT 30 tablet 0  . metFORMIN (GLUCOPHAGE) 1000 MG tablet Take 1 tablet (1,000 mg total) by mouth 2 (two) times daily with a meal. (Start 1/2 tablet once daily, increase by 1/2 tablet as tolerated) 180 tablet 3  . Vitamin D, Ergocalciferol, (DRISDOL) 50000 units CAPS capsule Take 1 capsule (50,000 Units total) by mouth every 7 (seven)  days. Take for 12 total doses(weeks) 12 capsule 0   No current facility-administered medications for this visit.     No Known Allergies    Review of Systems:  Constitutional:  No  fever, no chills, No recent illness,  Cardiac: No  chest pain, No  pressure  Respiratory:  No  shortness of breath. No  Cough  Skin: No  Rash,  Neurologic: No  weakness, No  dizziness  Psychiatric: No  concerns with depression, No  concerns with  anxiety  Exam:  BP 111/71   Pulse 72   Wt 214 lb 0.6 oz (97.1 kg)   BMI 33.52 kg/m   Constitutional: VS see above. General Appearance: alert, well-developed, well-nourished, NAD  Eyes: Normal lids and conjunctive, non-icteric sclera  Ears, Nose, Mouth, Throat: MMM, Normal external inspection ears/nares/mouth/lips/gums.   Neck: No masses, trachea midline. No thyroid enlargement.   Respiratory: Normal respiratory effort. no wheeze, no rhonchi, no rales  Cardiovascular: S1/S2 normal, no murmur, no rub/gallop auscultated. RRR.   Musculoskeletal: Gait normal.   Neurological: Normal balance/coordination. No tremor.     Psychiatric: Normal judgment/insight. Normal mood and affect. Oriented x3.      ASSESSMENT/PLAN:   Type 2 diabetes mellitus without complication, without long-term current use of insulin (HCC) - Plan: COMPLETE METABOLIC PANEL WITH GFR, Lipid panel  Elevated PSA - Plan: PSA, Total with Reflex to PSA, Free  Elevated liver enzymes - Plan: COMPLETE METABOLIC PANEL WITH GFR, Lipid panel  Vitamin D deficiency - Plan: VITAMIN D 25 Hydroxy (Vit-D Deficiency, Fractures)  Patient Instructions  Plan:  Increase Atorvastatin to 40 mg daily (double pu on the 20 mg tablets you have and I'll refill the 40 mg)  Recheck labs in another 6-8 weeks before your next follow-up in the office   Check fasting sugars dialy for next week or so - message me the numbers, if >150 consistently may need to add a third medications                     Visit summary with medication list and pertinent instructions was printed for patient to review. All questions at time of visit were answered - patient instructed to contact office with any additional concerns. ER/RTC precautions were reviewed with the patient.   Follow-up plan: Return in about 2 months (around 02/15/2018) for recheck A1C, sooner if needed .  Note: Total time spent 25 minutes, greater than 50% of the visit  was spent face-to-face counseling and coordinating care for the following: The primary encounter diagnosis was Type 2 diabetes mellitus without complication, without long-term current use of insulin ([place]). Diagnoses of Elevated PSA, Elevated liver enzymes, and Vitamin D deficiency were also pertinent to this visit.Marland Kitchen  Please note: voice recognition software was used to produce this document, and typos may escape review. Please contact Dr. Sheppard Coil for any needed clarifications.

## 2017-12-15 NOTE — Patient Instructions (Signed)
Plan:  Increase Atorvastatin to 40 mg daily (double pu on the 20 mg tablets you have and I'll refill the 40 mg)  Recheck labs in another 6-8 weeks before your next follow-up in the office   Check fasting sugars dialy for next week or so - message me the numbers, if >150 consistently may need to add a third medications

## 2017-12-22 ENCOUNTER — Other Ambulatory Visit: Payer: Self-pay | Admitting: Osteopathic Medicine

## 2017-12-22 DIAGNOSIS — E039 Hypothyroidism, unspecified: Secondary | ICD-10-CM

## 2018-01-23 ENCOUNTER — Other Ambulatory Visit: Payer: Self-pay | Admitting: Osteopathic Medicine

## 2018-01-23 DIAGNOSIS — E119 Type 2 diabetes mellitus without complications: Secondary | ICD-10-CM

## 2018-02-10 DIAGNOSIS — R972 Elevated prostate specific antigen [PSA]: Secondary | ICD-10-CM | POA: Diagnosis not present

## 2018-02-10 DIAGNOSIS — E119 Type 2 diabetes mellitus without complications: Secondary | ICD-10-CM | POA: Diagnosis not present

## 2018-02-10 DIAGNOSIS — R748 Abnormal levels of other serum enzymes: Secondary | ICD-10-CM | POA: Diagnosis not present

## 2018-02-10 DIAGNOSIS — E559 Vitamin D deficiency, unspecified: Secondary | ICD-10-CM | POA: Diagnosis not present

## 2018-02-13 LAB — COMPLETE METABOLIC PANEL WITH GFR
AG Ratio: 1.4 (calc) (ref 1.0–2.5)
ALT: 27 U/L (ref 9–46)
AST: 27 U/L (ref 10–35)
Albumin: 4.5 g/dL (ref 3.6–5.1)
Alkaline phosphatase (APISO): 79 U/L (ref 40–115)
BUN: 12 mg/dL (ref 7–25)
CO2: 27 mmol/L (ref 20–32)
Calcium: 9.7 mg/dL (ref 8.6–10.3)
Chloride: 103 mmol/L (ref 98–110)
Creat: 0.89 mg/dL (ref 0.70–1.25)
GFR, Est African American: 108 mL/min/{1.73_m2} (ref >=60)
GFR, Est Non African American: 93 mL/min/{1.73_m2} (ref >=60)
Globulin: 3.3 g/dL (calc) (ref 1.9–3.7)
Glucose, Bld: 109 mg/dL — ABNORMAL HIGH (ref 65–99)
Potassium: 4.3 mmol/L (ref 3.5–5.3)
Sodium: 140 mmol/L (ref 135–146)
Total Bilirubin: 0.7 mg/dL (ref 0.2–1.2)
Total Protein: 7.8 g/dL (ref 6.1–8.1)

## 2018-02-13 LAB — LIPID PANEL
Cholesterol: 95 mg/dL (ref <200)
HDL: 45 mg/dL (ref >40)
LDL Cholesterol (Calc): 28 mg/dL (calc)
Non-HDL Cholesterol (Calc): 50 mg/dL (calc) (ref <130)
Total CHOL/HDL Ratio: 2.1 (calc) (ref <5.0)
Triglycerides: 140 mg/dL (ref <150)

## 2018-02-13 LAB — PSA, TOTAL WITH REFLEX TO PSA, FREE: PSA, Total: 6.3 ng/mL — ABNORMAL HIGH (ref <=4.0)

## 2018-02-13 LAB — REFLEX PSA, FREE
PSA, % Free: 17 % (calc) — ABNORMAL LOW (ref >25)
PSA, Free: 1.1 ng/mL

## 2018-02-13 LAB — VITAMIN D 25 HYDROXY (VIT D DEFICIENCY, FRACTURES): Vit D, 25-Hydroxy: 46 ng/mL (ref 30–100)

## 2018-02-15 ENCOUNTER — Other Ambulatory Visit: Payer: Self-pay | Admitting: Osteopathic Medicine

## 2018-02-15 ENCOUNTER — Ambulatory Visit: Payer: BLUE CROSS/BLUE SHIELD | Admitting: Osteopathic Medicine

## 2018-02-15 DIAGNOSIS — E119 Type 2 diabetes mellitus without complications: Secondary | ICD-10-CM

## 2018-02-19 ENCOUNTER — Other Ambulatory Visit: Payer: Self-pay | Admitting: Osteopathic Medicine

## 2018-02-19 DIAGNOSIS — E119 Type 2 diabetes mellitus without complications: Secondary | ICD-10-CM

## 2018-02-21 ENCOUNTER — Encounter: Payer: Self-pay | Admitting: Osteopathic Medicine

## 2018-02-21 ENCOUNTER — Ambulatory Visit: Payer: BLUE CROSS/BLUE SHIELD | Admitting: Osteopathic Medicine

## 2018-02-21 VITALS — BP 113/69 | HR 66 | Temp 98.1°F | Wt 208.0 lb

## 2018-02-21 DIAGNOSIS — R972 Elevated prostate specific antigen [PSA]: Secondary | ICD-10-CM | POA: Diagnosis not present

## 2018-02-21 DIAGNOSIS — E1169 Type 2 diabetes mellitus with other specified complication: Secondary | ICD-10-CM

## 2018-02-21 DIAGNOSIS — E039 Hypothyroidism, unspecified: Secondary | ICD-10-CM

## 2018-02-21 DIAGNOSIS — R748 Abnormal levels of other serum enzymes: Secondary | ICD-10-CM

## 2018-02-21 DIAGNOSIS — E785 Hyperlipidemia, unspecified: Secondary | ICD-10-CM | POA: Diagnosis not present

## 2018-02-21 DIAGNOSIS — E119 Type 2 diabetes mellitus without complications: Secondary | ICD-10-CM | POA: Diagnosis not present

## 2018-02-21 DIAGNOSIS — E559 Vitamin D deficiency, unspecified: Secondary | ICD-10-CM | POA: Diagnosis not present

## 2018-02-21 MED ORDER — EMPAGLIFLOZIN 25 MG PO TABS: 25.0000 mg | ORAL_TABLET | Freq: Every day | ORAL | 3 refills | Status: DC

## 2018-02-21 MED ORDER — ATORVASTATIN CALCIUM 40 MG PO TABS: 40.0000 mg | ORAL_TABLET | Freq: Every day | ORAL | 3 refills | Status: DC

## 2018-02-21 NOTE — Patient Instructions (Signed)
Please get in touch with urology re: PSA levels increasing Will await A1C levels and adjust meds if needed  Any problems with meds/pharmacy, have the pharmacy contact us

## 2018-02-21 NOTE — Progress Notes (Signed)
HPI: Paul Reed is a 62 y.o. male who  has a past medical history of Bursitis, Chronic kidney disease, ELEVATED PROSTATE SPECIFIC ANTIGEN (10/23/2008), Hypothyroidism, Impaired fasting glucose, Obesity, and OSA on CPAP.  he presents to [facility] today, 02/21/18,  for chief complaint of: labs follow up, DM2 recheck    A1C previous visit 11.1, he had previously had high levels but never followed up to address this. Metformin and Jardiance were Rx'ed. He reports he was not careful about his diet in the past but recently has been reading nutrition labels, avoiding carbohydrates. Has lost a bit of weight since last visit.  Elevated liver enzymes: see labs. EtOH is not a problem for him. Enzymes normal range on recheck last month.   Other lab abnormalities last visit: Protein totals a bit above normal, globulin levels a bit above normal. Gap is okay. These issues have resolved on most recent blood work.  Hyperlipidemia: no meds, diet as noted above, he is not on any particular dietary restrictions and diet is overall unhealthy but working to improve with diet/exercise. Atorvastatin 40 mg was  on his medication list that he is only taking 20 mg. at last visit, we discussed increasing the 20 mg until he was out and then switching to one a day for 40 mg, he's actually been taking 2 a day of the 40 mg and has been out.  Hypothyroid: most recent TSH normal range.   Vit D def: quite low last check, Rx supplement was started. Vitamin D currently back into normal range.  PSA elevation w/ history of prostate biopsy several years ago which was negative, PSAs have been actually trending down overall since then but is now going back up.    Past medical, surgical, social and family history reviewed:  Patient Active Problem List   Diagnosis Date Noted  . Vitamin D deficiency 11/16/2017  . Hyperlipidemia associated with type 2 diabetes mellitus ([place]) 11/16/2017  .  Elevated liver enzymes 11/16/2017  . Annual physical exam 09/11/2016  . Solitary pulmonary nodule 01/06/2016  . Type 2 diabetes mellitus ([place]) 10/29/2015  . Hyperglycemia 10/28/2015  . Hypertriglyceridemia 02/08/2013  . CALF PAIN, LEFT 10/30/2010  . Elevated PSA 10/23/2008  . Hypothyroidism 06/01/2007  . OBESITY NOS 06/01/2007  . NEPHROLITHIASIS 06/01/2007    Past Surgical History:  Procedure Laterality Date  . ESOPHAGUS SURGERY     Removed glass in infancy    Social History   Tobacco Use  . Smoking status: Never Smoker  . Smokeless tobacco: Never Used  Substance Use Topics  . Alcohol use: Yes    Comment: very infrequent    Family History  Problem Relation Age of Onset  . Cancer Father   . Thyroid disease Unknown        mother     Current medication list and allergy/intolerance information reviewed:    Current Outpatient Medications  Medication Sig Dispense Refill  . atorvastatin (LIPITOR) 40 MG tablet Take 1 tablet (40 mg total) by mouth daily. 90 tablet 3  . blood glucose meter kit and supplies KIT Dispense based on patient and insurance preference. Use up to four times daily as directed. Please include lancets, test strips, control solution. 1 each prn  . empagliflozin (JARDIANCE) 25 MG TABS tablet Take 25 mg by mouth daily. 90 tablet 3  . levothyroxine (SYNTHROID, LEVOTHROID) 112 MCG tablet Take 1 tablet (112 mcg total) by mouth daily before breakfast. 30 tablet 2  . metFORMIN (  GLUCOPHAGE) 1000 MG tablet Take 1 tablet (1,000 mg total) by mouth 2 (two) times daily with a meal. (Start 1/2 tablet once daily, increase by 1/2 tablet as tolerated) 180 tablet 3  . Vitamin D, Ergocalciferol, (DRISDOL) 50000 units CAPS capsule Take 1 capsule (50,000 Units total) by mouth every 7 (seven) days. Take for 12 total doses(weeks) 12 capsule 0   No current facility-administered medications for this visit.     No Known Allergies    Review of Systems:  Constitutional:  No   fever, no chills, No recent illness,  Cardiac: No  chest pain, No  pressure  Respiratory:  No  shortness of breath. No  Cough  Skin: No  Rash,  Neurologic: No  weakness, No  dizziness  Psychiatric: No  concerns with depression, No  concerns with anxiety  Exam:  BP 113/69   Pulse 66   Temp 98.1 F (36.7 C) (Oral)   Wt 208 lb 0.6 oz (94.4 kg)   BMI 32.58 kg/m   Constitutional: VS see above. General Appearance: alert, well-developed, well-nourished, NAD  Eyes: Normal lids and conjunctive, non-icteric sclera  Ears, Nose, Mouth, Throat: MMM, Normal external inspection ears/nares/mouth/lips/gums.   Neck: No masses, trachea midline. No thyroid enlargement.   Respiratory: Normal respiratory effort. no wheeze, no rhonchi, no rales  Cardiovascular: S1/S2 normal, no murmur, no rub/gallop auscultated. RRR.   Musculoskeletal: Gait normal.   Neurological: Normal balance/coordination. No tremor.     Psychiatric: Normal judgment/insight. Normal mood and affect. Oriented x3.    Recent Results (from the past 2160 hour(s))  COMPLETE METABOLIC PANEL WITH GFR     Status: Abnormal   Collection Time: 02/10/18  3:36 PM  Result Value Ref Range   Glucose, Bld 109 (H) 65 - 99 mg/dL    Comment: .            Fasting reference interval . For someone without known diabetes, a glucose value between 100 and 125 mg/dL is consistent with prediabetes and should be confirmed with a follow-up test. .    BUN 12 7 - 25 mg/dL   Creat 0.89 0.70 - 1.25 mg/dL    Comment: For patients >66 years of age, the reference limit for Creatinine is approximately 13% higher for people identified as African-American. .    GFR, Est Non African American 93 > OR = 60 mL/min/1.21m   GFR, Est African American 108 > OR = 60 mL/min/1.773m  BUN/Creatinine Ratio NOT APPLICABLE 6 - 22 (calc)   Sodium 140 135 - 146 mmol/L   Potassium 4.3 3.5 - 5.3 mmol/L   Chloride 103 98 - 110 mmol/L   CO2 27 20 - 32 mmol/L    Calcium 9.7 8.6 - 10.3 mg/dL   Total Protein 7.8 6.1 - 8.1 g/dL   Albumin 4.5 3.6 - 5.1 g/dL   Globulin 3.3 1.9 - 3.7 g/dL (calc)   AG Ratio 1.4 1.0 - 2.5 (calc)   Total Bilirubin 0.7 0.2 - 1.2 mg/dL   Alkaline phosphatase (APISO) 79 40 - 115 U/L   AST 27 10 - 35 U/L   ALT 27 9 - 46 U/L  Lipid panel     Status: None   Collection Time: 02/10/18  3:36 PM  Result Value Ref Range   Cholesterol 95 <200 mg/dL   HDL 45 >40 mg/dL   Triglycerides 140 <150 mg/dL   LDL Cholesterol (Calc) 28 mg/dL (calc)    Comment: Reference range: <100 . Desirable range <100 mg/dL  for primary prevention;   <70 mg/dL for patients with CHD or diabetic patients  with > or = 2 CHD risk factors. Marland Kitchen LDL-C is now calculated using the Martin-Hopkins  calculation, which is a validated novel method providing  better accuracy than the Friedewald equation in the  estimation of LDL-C.  Cresenciano Genre et al. Annamaria Helling. 6720;947(09): 2061-2068  (http://education.[facility].com/faq/FAQ164)    Total CHOL/HDL Ratio 2.1 <5.0 (calc)   Non-HDL Cholesterol (Calc) 50 <130 mg/dL (calc)    Comment: For patients with diabetes plus 1 major ASCVD risk  factor, treating to a non-HDL-C goal of <100 mg/dL  (LDL-C of <70 mg/dL) is considered a therapeutic  option.   VITAMIN D 25 Hydroxy (Vit-D Deficiency, Fractures)     Status: None   Collection Time: 02/10/18  3:36 PM  Result Value Ref Range   Vit D, 25-Hydroxy 46 30 - 100 ng/mL    Comment: Vitamin D Status         25-OH Vitamin D: . Deficiency:                    <20 ng/mL Insufficiency:             20 - 29 ng/mL Optimal:                 > or = 30 ng/mL . For 25-OH Vitamin D testing on patients on  D2-supplementation and patients for whom quantitation  of D2 and D3 fractions is required, the QuestAssureD(TM) 25-OH VIT D, (D2,D3), LC/MS/MS is recommended: order  code [id number] (patients >50yr). . For more information on this test, go  to: http://education.quest[facility].com/faq/FAQ163 (This link is being provided for  informational/educational purposes only.)   PSA, Total with Reflex to PSA, Free     Status: Abnormal   Collection Time: 02/10/18  3:36 PM  Result Value Ref Range   PSA, Total 6.3 (H) < OR = 4.0 ng/mL  reflex PSA, Free     Status: Abnormal   Collection Time: 02/10/18  3:36 PM  Result Value Ref Range   PSA, Free 1.1 ng/mL   PSA, % Free 17 (L) >25 % (calc)    Comment: . PSA(ng/mL)      Free PSA(%)     Estimated(x) Probability                                      of Cancer(as%) 0-2.5              (*)               Approx. 1 2.6-4.0(1)         0-27(2)                   24(3) 4.1-10(4)          0-10                      56                    11-15                     28                    16-20  20                    21-25                     16                    >or =26                   8 >10(+)             N/A                      >50 . References:(1)Catalona et al.:Urology 60: 469-474 (2002)            (2)Catalona et al.:J.Urol 168: 922-925 (2002)               Free PSA(%)   Sensitivity(%)  Specificity(%)               < or = 25          85              19               < or = 30          93               9            (3)Catalona et al.:JAMA 277: 1452-1455 (1997)            (4)Catalona et al.:JAMA 279: 8563-1497 (1998) . (x)These estimates vary with age, ethnicity, family     history and DRE results. (*)The  diagnostic usefulness of % Free PSA has not been    established in patients with total PSA below 2.6 ng/mL (+)In men with PSA above 10 ng/mL, prostate cancer risk is    determined by total PSA alone. . The Total PSA value from this assay system is  standardized against the equimolar PSA standard.  The test result will be approximately 20% higher  when compared to the [facility] Total PSA  (Siemens assay). Comparison of serial PSA results  should be  interpreted with this fact in mind. Marland Kitchen PSA was performed using the Beckman Coulter Immunoassay method. Values obtained from different assay methods cannot be used interchangeably. PSA levels, regardless of value, should not be interpreted as absolute evidence of the presence or absence of disease. .      ASSESSMENT/PLAN:   Unable to performed point-of-care A1c testing, patient sent downstairs for blood draw.  Other chronic medical issues appear to be improving with the exception of PSA trending upward. Patient is advised to contact his urologist  Type 2 diabetes mellitus without complication, without long-term current use of insulin ([place]) - Plan: atorvastatin (LIPITOR) 40 MG tablet, empagliflozin (JARDIANCE) 25 MG TABS tablet, Hemoglobin A1c  Elevated PSA  Elevated liver enzymes  Hypothyroidism, unspecified type  Vitamin D deficiency  Hyperlipidemia associated with type 2 diabetes mellitus ([place])   Patient Instructions  Please get in touch with urology re: PSA levels increasing Will await A1C levels and adjust meds if needed  Any problems with meds/pharmacy, have the pharmacy contact us      Visit summary with medication list and pertinent instructions was printed for patient to review. All questions at time of visit were answered - patient instructed to contact office with any additional concerns. ER/RTC  precautions were reviewed with the patient.   Follow-up plan: Return in about 3 months (around 05/24/2018) for Recheck diabetes, assuming normal A1c today..  Note: Total time spent 25 minutes, greater than 50% of the visit was spent face-to-face counseling and coordinating care for the following: The primary encounter diagnosis was Type 2 diabetes mellitus without complication, without long-term current use of insulin ([place]). Diagnoses of Elevated PSA, Elevated liver enzymes, Hypothyroidism, unspecified type, Vitamin D deficiency, and Hyperlipidemia associated with type 2  diabetes mellitus ([place]) were also pertinent to this visit.Marland Kitchen  Please note: voice recognition software was used to produce this document, and typos may escape review. Please contact Dr. Sheppard Coil for any needed clarifications.

## 2018-02-22 LAB — HEMOGLOBIN A1C
Hgb A1c MFr Bld: 7 % of total Hgb — ABNORMAL HIGH (ref <5.7)
Mean Plasma Glucose: 154 (calc)
eAG (mmol/L): 8.5 (calc)

## 2018-03-24 ENCOUNTER — Other Ambulatory Visit: Payer: Self-pay | Admitting: Osteopathic Medicine

## 2018-03-24 DIAGNOSIS — E039 Hypothyroidism, unspecified: Secondary | ICD-10-CM

## 2018-04-19 DIAGNOSIS — H9312 Tinnitus, left ear: Secondary | ICD-10-CM | POA: Diagnosis not present

## 2018-04-19 DIAGNOSIS — H6122 Impacted cerumen, left ear: Secondary | ICD-10-CM | POA: Diagnosis not present

## 2018-05-24 ENCOUNTER — Ambulatory Visit: Payer: BLUE CROSS/BLUE SHIELD | Admitting: Osteopathic Medicine

## 2018-05-24 ENCOUNTER — Encounter: Payer: Self-pay | Admitting: Osteopathic Medicine

## 2018-05-24 VITALS — BP 111/68 | HR 67 | Temp 98.1°F | Wt 205.2 lb

## 2018-05-24 DIAGNOSIS — E785 Hyperlipidemia, unspecified: Secondary | ICD-10-CM

## 2018-05-24 DIAGNOSIS — E119 Type 2 diabetes mellitus without complications: Secondary | ICD-10-CM | POA: Diagnosis not present

## 2018-05-24 DIAGNOSIS — E1169 Type 2 diabetes mellitus with other specified complication: Secondary | ICD-10-CM

## 2018-05-24 DIAGNOSIS — R972 Elevated prostate specific antigen [PSA]: Secondary | ICD-10-CM

## 2018-05-24 DIAGNOSIS — E559 Vitamin D deficiency, unspecified: Secondary | ICD-10-CM | POA: Diagnosis not present

## 2018-05-24 DIAGNOSIS — E039 Hypothyroidism, unspecified: Secondary | ICD-10-CM | POA: Diagnosis not present

## 2018-05-24 DIAGNOSIS — R748 Abnormal levels of other serum enzymes: Secondary | ICD-10-CM

## 2018-05-24 DIAGNOSIS — R21 Rash and other nonspecific skin eruption: Secondary | ICD-10-CM

## 2018-05-24 LAB — POCT GLYCOSYLATED HEMOGLOBIN (HGB A1C): Hemoglobin A1C: 7 % — AB (ref 4.0–5.6)

## 2018-05-24 NOTE — Progress Notes (Signed)
HPI: Paul Reed is a 62 y.o. male who  has a past medical history of Bursitis, Chronic kidney disease, ELEVATED PROSTATE SPECIFIC ANTIGEN (10/23/2008), Hypothyroidism, Impaired fasting glucose, Obesity, and OSA on CPAP.  he presents to [facility] today, 05/24/18,  for chief complaint of: labs follow up, DM2 recheck    A1C 10/2017 was 11.1, he had previously had high levels but never followed up to address this. Metformin and Jardiance were Rx'ed. He reports he was not careful about his diet in the past but since high A1C has been reading nutrition labels, avoiding carbohydrates. Next A1C 02/2018 was much better, 7.0. Today, 05/24/18 stable at 7.0  New concern: tick bite on the back, was taken off last week (6-7 days ago), definitely <24 h attached.   Rash on legs: concern for poison ivy/oak exposure, rash is clearing up but just wanted me to take a look   Hx Elevated liver enzymes: corrected nicely, see labs. EtOH is not a problem for him.   Other old lab abnormalities: Protein totals were a bit above normal, globulin levels were a bit above normal. Gap was okay. These issues have resolved on most recent blood work.  Hyperlipidemia: We discussed increasing the 20 mg until he was out and then switching to one a day for 40 mg, he wa actually been taking 2 a day of the 40 mg and has been out. We noted that 40 mg was okay at last viist, still considered high potency statin in accordance w/ DM2 guidelines   Hypothyroid: most recent TSH 10/2017 was normal range.   Vit D def: quite low previously, Rx supplement was started. Vitamin D was back into normal range.  PSA elevation w/ history of prostate biopsy several years ago which was negative, PSAs have been actually trending down overall since then but is now going back up. 3 mos ago was 6.3. Referred to urology, instructed to call them at last visit, he still has not contacted them.     Past medical,  surgical, social and family history reviewed:  Patient Active Problem List   Diagnosis Date Noted  . Vitamin D deficiency 11/16/2017  . Hyperlipidemia associated with type 2 diabetes mellitus ([place]) 11/16/2017  . Elevated liver enzymes 11/16/2017  . Annual physical exam 09/11/2016  . Solitary pulmonary nodule 01/06/2016  . Type 2 diabetes mellitus ([place]) 10/29/2015  . Hyperglycemia 10/28/2015  . Hypertriglyceridemia 02/08/2013  . CALF PAIN, LEFT 10/30/2010  . Elevated PSA 10/23/2008  . Hypothyroidism 06/01/2007  . OBESITY NOS 06/01/2007  . NEPHROLITHIASIS 06/01/2007    Past Surgical History:  Procedure Laterality Date  . ESOPHAGUS SURGERY     Removed glass in infancy    Social History   Tobacco Use  . Smoking status: Never Smoker  . Smokeless tobacco: Never Used  Substance Use Topics  . Alcohol use: Yes    Comment: very infrequent    Family History  Problem Relation Age of Onset  . Cancer Father   . Thyroid disease Unknown        mother     Current medication list and allergy/intolerance information reviewed:    Current Outpatient Medications  Medication Sig Dispense Refill  . atorvastatin (LIPITOR) 40 MG tablet Take 1 tablet (40 mg total) by mouth daily. 90 tablet 3  . blood glucose meter kit and supplies KIT Dispense based on patient and insurance preference. Use up to four times daily as directed. Please include lancets, test strips, control  solution. 1 each prn  . empagliflozin (JARDIANCE) 25 MG TABS tablet Take 25 mg by mouth daily. 90 tablet 3  . levothyroxine (SYNTHROID, LEVOTHROID) 112 MCG tablet TAKE 1 TABLET(112 MCG) BY MOUTH DAILY BEFORE BREAKFAST 90 tablet 3  . metFORMIN (GLUCOPHAGE) 1000 MG tablet Take 1 tablet (1,000 mg total) by mouth 2 (two) times daily with a meal. (Start 1/2 tablet once daily, increase by 1/2 tablet as tolerated) 180 tablet 3  . Vitamin D, Ergocalciferol, (DRISDOL) 50000 units CAPS capsule Take 1 capsule (50,000 Units total) by mouth  every 7 (seven) days. Take for 12 total doses(weeks) 12 capsule 0   No current facility-administered medications for this visit.     No Known Allergies    Review of Systems:  Constitutional:  No  fever, no chills, No recent illness, feels well today   Cardiac: No  chest pain, No  Pressure, no palpitations   Respiratory:  No  shortness of breath.   Skin: +Rash  Neurologic: No  weakness, No  dizziness  Psychiatric: No  concerns with depression, No  concerns with anxiety  Exam:  BP 111/68 (BP Location: Left Arm, Patient Position: Sitting, Cuff Size: Normal)   Pulse 67   Temp 98.1 F (36.7 C) (Oral)   Wt 205 lb 3.2 oz (93.1 kg)   BMI 32.14 kg/m   Constitutional: VS see above. General Appearance: alert, well-developed, well-nourished, NAD  Eyes: Normal lids and conjunctive, non-icteric sclera  Ears, Nose, Mouth, Throat: MMM, Normal external inspection ears/nares/mouth/lips/gums.   Neck: No masses, trachea midline. No thyroid enlargement.   Respiratory: Normal respiratory effort. no wheeze, no rhonchi, no rales  Cardiovascular: S1/S2 normal, no murmur, no rub/gallop auscultated. RRR.   Skin: small raised rash on back c/w reaction to tick but but no erythema migrans rash. Healing blisters on legs ina streak c/w plant exposure, no eruthema/drainage   Musculoskeletal: Gait normal.   Neurological: Normal balance/coordination. No tremor.     Psychiatric: Normal judgment/insight. Normal mood and affect. Oriented x3.    Recent Results (from the past 2160 hour(s))  POCT HgB A1C     Status: Abnormal   Collection Time: 05/24/18 10:28 AM  Result Value Ref Range   Hemoglobin A1C 7.0 (A) 4.0 - 5.6 %   HbA1c, POC (prediabetic range)  5.7 - 6.4 %   HbA1c, POC (controlled diabetic range)  0.0 - 7.0 %     ASSESSMENT/PLAN:   A1C stable today, ideally would get this closer to 6.5, work on diet/exercise   Other chronic medical issues improving with the exception of PSA  trending upward. He was advised to contact urologist.   Labs ordered for next visit / annual   Type 2 diabetes mellitus without complication, without long-term current use of insulin ([place]) - stable, could be better, work on diet/exercise - Plan: POCT HgB A1C, CBC, COMPLETE METABOLIC PANEL WITH GFR, Lipid panel, Hemoglobin A1c  Elevated PSA - CALL THE UROLOGIST  - Plan: PSA, Total with Reflex to PSA, Free  Vitamin D deficiency - Plan: VITAMIN D 25 Hydroxy (Vit-D Deficiency, Fractures)  Elevated liver enzymes - Plan: CBC, COMPLETE METABOLIC PANEL WITH GFR  Hyperlipidemia associated with type 2 diabetes mellitus ([place]) - Plan: Lipid panel  Hypothyroidism, unspecified type - Plan: TSH  Rash and nonspecific skin eruption - n oconcerns for worrisome infectious process or other cause than exposure/contact dermatitis - follow and RTC prn    Patient Instructions   [facility]: [phone]  For rash: over the  counter Calamine lotion, +/- Hydrocortisone cream/ointment/lotion for itching    Labs ordered for next visit - can get blood work done at least 2 days prior to appointment for annual      Visit summary with medication list and pertinent instructions was printed for patient to review. All questions at time of visit were answered - patient instructed to contact office with any additional concerns. ER/RTC precautions were reviewed with the patient.   Follow-up plan: Return in about 3 months (around 08/24/2018) for [place] .  Note: Total time spent 25 minutes, greater than 50% of the visit was spent face-to-face counseling and coordinating care for the following: The primary encounter diagnosis was Type 2 diabetes mellitus without complication, without long-term current use of insulin ([place]). Diagnoses of Elevated PSA, Vitamin D deficiency, Elevated liver enzymes, Hyperlipidemia associated with type 2 diabetes mellitus ([place]), Hypothyroidism, unspecified type, and Rash and  nonspecific skin eruption were also pertinent to this visit.Marland Kitchen  Please note: voice recognition software was used to produce this document, and typos may escape review. Please contact Dr. Sheppard Coil for any needed clarifications.

## 2018-05-24 NOTE — Patient Instructions (Addendum)
   [facility]: [phone]  For rash: over the counter Calamine lotion, +/- Hydrocortisone cream/ointment/lotion for itching    Labs ordered for next visit - can get blood work done at least 2 days prior to appointment for annual

## 2018-11-20 ENCOUNTER — Encounter: Payer: BLUE CROSS/BLUE SHIELD | Admitting: Osteopathic Medicine

## 2018-11-21 ENCOUNTER — Ambulatory Visit (INDEPENDENT_AMBULATORY_CARE_PROVIDER_SITE_OTHER): Payer: BLUE CROSS/BLUE SHIELD | Admitting: Osteopathic Medicine

## 2018-11-21 ENCOUNTER — Encounter: Payer: Self-pay | Admitting: Osteopathic Medicine

## 2018-11-21 ENCOUNTER — Telehealth: Payer: Self-pay | Admitting: Osteopathic Medicine

## 2018-11-21 VITALS — BP 122/74 | HR 68 | Temp 97.5°F | Wt 207.6 lb

## 2018-11-21 DIAGNOSIS — E119 Type 2 diabetes mellitus without complications: Secondary | ICD-10-CM

## 2018-11-21 DIAGNOSIS — Z0000 Encounter for general adult medical examination without abnormal findings: Secondary | ICD-10-CM | POA: Diagnosis not present

## 2018-11-21 DIAGNOSIS — R972 Elevated prostate specific antigen [PSA]: Secondary | ICD-10-CM

## 2018-11-21 DIAGNOSIS — E1169 Type 2 diabetes mellitus with other specified complication: Secondary | ICD-10-CM | POA: Diagnosis not present

## 2018-11-21 DIAGNOSIS — E785 Hyperlipidemia, unspecified: Secondary | ICD-10-CM

## 2018-11-21 DIAGNOSIS — E039 Hypothyroidism, unspecified: Secondary | ICD-10-CM

## 2018-11-21 DIAGNOSIS — E559 Vitamin D deficiency, unspecified: Secondary | ICD-10-CM | POA: Diagnosis not present

## 2018-11-21 DIAGNOSIS — Z23 Encounter for immunization: Secondary | ICD-10-CM

## 2018-11-21 NOTE — Progress Notes (Signed)
HPI: Paul Reed is a 62 y.o. male who  has a past medical history of Bursitis, Chronic kidney disease, ELEVATED PROSTATE SPECIFIC ANTIGEN (10/23/2008), Hypothyroidism, Impaired fasting glucose, Obesity, and OSA on CPAP.  he presents to [facility] today, 11/21/18,  for chief complaint of: ANNUAL PHYSICAL     Patient here for annual physical / wellness exam.  See preventive care reviewed as below.    Additional concerns today include:  none    Past medical, surgical, social and family history reviewed:  Patient Active Problem List   Diagnosis Date Noted  . Vitamin D deficiency 11/16/2017  . Hyperlipidemia associated with type 2 diabetes mellitus ([place]) 11/16/2017  . Elevated liver enzymes 11/16/2017  . Annual physical exam 09/11/2016  . Solitary pulmonary nodule 01/06/2016  . Type 2 diabetes mellitus ([place]) 10/29/2015  . Hyperglycemia 10/28/2015  . Hypertriglyceridemia 02/08/2013  . CALF PAIN, LEFT 10/30/2010  . Elevated PSA 10/23/2008  . Hypothyroidism 06/01/2007  . OBESITY NOS 06/01/2007  . NEPHROLITHIASIS 06/01/2007    Past Surgical History:  Procedure Laterality Date  . ESOPHAGUS SURGERY     Removed glass in infancy    Social History   Tobacco Use  . Smoking status: Never Smoker  . Smokeless tobacco: Never Used  Substance Use Topics  . Alcohol use: Yes    Comment: very infrequent    Family History  Problem Relation Age of Onset  . Cancer Father   . Thyroid disease Unknown        mother     Current medication list and allergy/intolerance information reviewed:    Current Outpatient Medications  Medication Sig Dispense Refill  . atorvastatin (LIPITOR) 40 MG tablet Take 1 tablet (40 mg total) by mouth daily. 90 tablet 3  . blood glucose meter kit and supplies KIT Dispense based on patient and insurance preference. Use up to four times daily as directed. Please include lancets, test strips, control solution. 1 each  prn  . empagliflozin (JARDIANCE) 25 MG TABS tablet Take 25 mg by mouth daily. 90 tablet 3  . levothyroxine (SYNTHROID, LEVOTHROID) 112 MCG tablet TAKE 1 TABLET(112 MCG) BY MOUTH DAILY BEFORE BREAKFAST 90 tablet 3  . metFORMIN (GLUCOPHAGE) 1000 MG tablet Take 1 tablet (1,000 mg total) by mouth 2 (two) times daily with a meal. (Start 1/2 tablet once daily, increase by 1/2 tablet as tolerated) 180 tablet 3  . Vitamin D, Ergocalciferol, (DRISDOL) 50000 units CAPS capsule Take 1 capsule (50,000 Units total) by mouth every 7 (seven) days. Take for 12 total doses(weeks) 12 capsule 0   No current facility-administered medications for this visit.     No Known Allergies    Review of Systems:  Constitutional:  No  fever, no chills, No recent illness, No unintentional weight changes. No significant fatigue.   HEENT: No  headache, no vision change, no hearing change, No sore throat, No  sinus pressure  Cardiac: No  chest pain, No  pressure, No palpitations, No  Orthopnea  Respiratory:  No  shortness of breath. No  Cough  Gastrointestinal: No  abdominal pain, No  nausea, No  vomiting,  No  blood in stool, No  diarrhea, No  constipation   Musculoskeletal: No new myalgia/arthralgia  Skin: No  Rash, No other wounds/concerning lesions  Genitourinary: No  incontinence, No  abnormal genital bleeding, No abnormal genital discharge  Hem/Onc: No  easy bruising/bleeding  Endocrine: No cold intolerance,  No heat intolerance. No polyuria/polydipsia/polyphagia   Neurologic:  No  weakness, No  dizziness, No  slurred speech/focal weakness/facial droop  Psychiatric: No  concerns with depression, No  concerns with anxiety, No sleep problems, No mood problems  Exam:  BP 122/74   Pulse 68   Temp (!) 97.5 F (36.4 C) (Oral)   Wt 207 lb 9.6 oz (94.2 kg)   BMI 32.51 kg/m   Constitutional: VS see above. General Appearance: alert, well-developed, well-nourished, NAD  Eyes: Normal lids and conjunctive,  non-icteric sclera  Ears, Nose, Mouth, Throat: MMM, Normal external inspection ears/nares/mouth/lips/gums. TM normal bilaterally. Pharynx/tonsils no erythema, no exudate. Nasal mucosa normal.   Neck: No masses, trachea midline. No thyroid enlargement. No tenderness/mass appreciated. No lymphadenopathy  Respiratory: Normal respiratory effort. no wheeze, no rhonchi, no rales  Cardiovascular: S1/S2 normal, no murmur, no rub/gallop auscultated. RRR. No lower extremity edema. Pedal pulse II/IV bilaterally DP and PT. No carotid bruit or JVD. No abdominal aortic bruit.  Gastrointestinal: Nontender, no masses. No hepatomegaly, no splenomegaly. +ventral hernia appreciated. Bowel sounds normal. Rectal exam deferred.   Musculoskeletal: Gait normal. No clubbing/cyanosis of digits.   Neurological: Normal balance/coordination. No tremor. No cranial nerve deficit on limited exam. Motor and sensation intact and symmetric. Cerebellar reflexes intact.   Skin: warm, dry, intact. No rash/ulcer. No concerning nevi or subq nodules on limited exam.    Psychiatric: Normal judgment/insight. Normal mood and affect. Oriented x3.       ASSESSMENT/PLAN: The primary encounter diagnosis was Annual physical exam. Diagnoses of Type 2 diabetes mellitus without complication, without long-term current use of insulin ([place]), Needs flu shot, Need for pneumococcal vaccination, Vitamin D deficiency, Elevated PSA, Hyperlipidemia associated with type 2 diabetes mellitus ([place]), and Hypothyroidism, unspecified type were also pertinent to this visit.  Orders Placed This Encounter  Procedures  . Flu Vaccine QUAD 6+ mos PF IM (Fluarix Quad PF)  . Pneumococcal polysaccharide vaccine 23-valent greater than or equal to 2yo subcutaneous/IM  . Microalbumin / creatinine urine ratio    Immunization History  Administered Date(s) Administered  . Influenza Split 10/12/2011, 10/10/2012  . Influenza Whole 10/12/2010  . Influenza,  Seasonal, Injecte, Preservative Fre 08/09/2013  . Influenza,inj,Quad PF,6+ Mos 09/06/2014, 10/28/2015, 09/10/2016, 09/15/2017, 11/21/2018  . Pneumococcal Polysaccharide-23 11/21/2018  . Td 06/04/2008    Patient Instructions  General Preventive Care  Most recent routine screening lipids/other labs: ordered, please get these done!   Everyone should have blood pressure checked once per year.   Tobacco: don't! Alcohol: responsible moderation is ok for most adults - if you have concerns about your alcohol intake, please talk to me! Recreational/Illicit Drugs: don't!  Exercise: as tolerated to reduce risk of cardiovascular disease and diabetes. Strength training will also prevent osteoporosis.   Mental health: if need for mental health care (medicines, counseling, other), or concerns about moods, please let me know!   Sexual health: if need for STD testing, or if concerns with libido/pain problems, please let me know! Vaccines  Flu vaccine: thanks for getting flu shot today!  Shingles vaccine: Shingrix recommended after age 10 - will put you on the clinic's wait list for this   Pneumonia vaccines: Pneumovax recommended after age 36, sooner if diabetes, COPD/asthma, others. Thanks for getting this vaccine today!   Tetanus booster: Tdap recommended every 10 years - will update this next time you're in the office. If you suffer a deep cut or puncture wound in the meantime, please get this shot done (here, ER, Urgent Care)!  Cancer screenings   Colon cancer  screening: we don't have any previous testing on file for you, but will plan to repeat this age 80 (82 years after first screening)   Prostate cancer screening: annual PSA blood test for men age 61-71  Lung cancer screening: not needed if never smoker  Infection screenings . HIV: recommended screening at least once age 32-65, done already, can recheck if ever needed  . Gonorrhea/Chlamydia: screening as needed . Hepatitis C:  recommended for anyone born 25-1963 - done already  . TB: certain at-risk populations, or depending on work requirements and/or travel history Other . Bone Density Test: recommended for men at age 80, sooner depending on risk factors . Advanced Directive: Living Will and/or Healthcare Power of Attorney recommended for all adults, regardless of age or health!       Visit summary with medication list and pertinent instructions was printed for patient to review. All questions at time of visit were answered - patient instructed to contact office with any additional concerns or updates. ER/RTC precautions were reviewed with the patient.      Please note: voice recognition software was used to produce this document, and typos may escape review. Please contact Dr. Sheppard Coil for any needed clarifications.     Follow-up plan: Return in about 3 months (around 02/20/2019) for A1C DIABETES FOLLOW-UP, sooner if needed.

## 2018-11-21 NOTE — Patient Instructions (Addendum)
General Preventive Care  Most recent routine screening lipids/other labs: ordered, please get these done!   Everyone should have blood pressure checked once per year.   Tobacco: don't! Alcohol: responsible moderation is ok for most adults - if you have concerns about your alcohol intake, please talk to me! Recreational/Illicit Drugs: don't!  Exercise: as tolerated to reduce risk of cardiovascular disease and diabetes. Strength training will also prevent osteoporosis.   Mental health: if need for mental health care (medicines, counseling, other), or concerns about moods, please let me know!   Sexual health: if need for STD testing, or if concerns with libido/pain problems, please let me know! Vaccines  Flu vaccine: thanks for getting flu shot today!  Shingles vaccine: Shingrix recommended after age 60 - will put you on the clinic's wait list for this   Pneumonia vaccines: Pneumovax recommended after age 60, sooner if diabetes, COPD/asthma, others. Thanks for getting this vaccine today!   Tetanus booster: Tdap recommended every 10 years - will update this next time you're in the office. If you suffer a deep cut or puncture wound in the meantime, please get this shot done (here, ER, Urgent Care)!  Cancer screenings   Colon cancer screening: we don't have any previous testing on file for you, but will plan to repeat this age 62 (10 years after first screening)   Prostate cancer screening: annual PSA blood test for men age 60-71  Lung cancer screening: not needed if never smoker  Infection screenings . HIV: recommended screening at least once age 60-65, done already, can recheck if ever needed  . Gonorrhea/Chlamydia: screening as needed . Hepatitis C: recommended for anyone born [id number] - done already  . TB: certain at-risk populations, or depending on work requirements and/or travel history Other . Bone Density Test: recommended for men at age 60, sooner depending on risk  factors . Advanced Directive: Living Will and/or Healthcare Power of Attorney recommended for all adults, regardless of age or health!

## 2018-11-21 NOTE — Telephone Encounter (Signed)
-----   Message from Sunnie NielsenNatalie Alexander, DO sent at 11/21/2018  9:20 AM EST ----- Regarding: shingles vax Shingrix!

## 2018-11-21 NOTE — Telephone Encounter (Signed)
Added

## 2018-11-22 ENCOUNTER — Other Ambulatory Visit: Payer: Self-pay | Admitting: Osteopathic Medicine

## 2018-11-22 DIAGNOSIS — E039 Hypothyroidism, unspecified: Secondary | ICD-10-CM

## 2018-11-22 LAB — CBC
HCT: 49.4 % (ref 38.5–50.0)
Hemoglobin: 16.5 g/dL (ref 13.2–17.1)
MCH: 30.3 pg (ref 27.0–33.0)
MCHC: 33.4 g/dL (ref 32.0–36.0)
MCV: 90.6 fL (ref 80.0–100.0)
MPV: 10.1 fL (ref 7.5–12.5)
Platelets: 302 10*3/uL (ref 140–400)
RBC: 5.45 10*6/uL (ref 4.20–5.80)
RDW: 12 % (ref 11.0–15.0)
WBC: 8.8 10*3/uL (ref 3.8–10.8)

## 2018-11-22 LAB — PSA, TOTAL WITH REFLEX TO PSA, FREE: PSA, Total: 7.1 ng/mL — ABNORMAL HIGH (ref <=4.0)

## 2018-11-22 LAB — HEMOGLOBIN A1C
Hgb A1c MFr Bld: 7.3 % of total Hgb — ABNORMAL HIGH (ref <5.7)
Mean Plasma Glucose: 163 (calc)
eAG (mmol/L): 9 (calc)

## 2018-11-22 LAB — COMPLETE METABOLIC PANEL WITH GFR
AG Ratio: 1.5 (calc) (ref 1.0–2.5)
ALT: 28 U/L (ref 9–46)
AST: 22 U/L (ref 10–35)
Albumin: 4.7 g/dL (ref 3.6–5.1)
Alkaline phosphatase (APISO): 86 U/L (ref 40–115)
BUN: 15 mg/dL (ref 7–25)
CO2: 26 mmol/L (ref 20–32)
Calcium: 9.4 mg/dL (ref 8.6–10.3)
Chloride: 102 mmol/L (ref 98–110)
Creat: 0.94 mg/dL (ref 0.70–1.25)
GFR, Est African American: 102 mL/min/{1.73_m2} (ref >=60)
GFR, Est Non African American: 88 mL/min/{1.73_m2} (ref >=60)
Globulin: 3.1 g/dL (calc) (ref 1.9–3.7)
Glucose, Bld: 130 mg/dL — ABNORMAL HIGH (ref 65–99)
Potassium: 4 mmol/L (ref 3.5–5.3)
Sodium: 137 mmol/L (ref 135–146)
Total Bilirubin: 0.9 mg/dL (ref 0.2–1.2)
Total Protein: 7.8 g/dL (ref 6.1–8.1)

## 2018-11-22 LAB — LIPID PANEL
Cholesterol: 104 mg/dL (ref <200)
HDL: 48 mg/dL (ref >40)
LDL Cholesterol (Calc): 35 mg/dL (calc)
Non-HDL Cholesterol (Calc): 56 mg/dL (calc) (ref <130)
Total CHOL/HDL Ratio: 2.2 (calc) (ref <5.0)
Triglycerides: 124 mg/dL (ref <150)

## 2018-11-22 LAB — REFLEX PSA, FREE
PSA, % Free: 20 % (calc) — ABNORMAL LOW (ref >25)
PSA, Free: 1.4 ng/mL

## 2018-11-22 LAB — TSH: TSH: 1.47 mIU/L (ref 0.40–4.50)

## 2018-11-22 LAB — MICROALBUMIN / CREATININE URINE RATIO
Creatinine, Urine: 80 mg/dL (ref 20–320)
Microalb Creat Ratio: 14 mcg/mg creat (ref <30)
Microalb, Ur: 1.1 mg/dL

## 2018-11-22 LAB — VITAMIN D 25 HYDROXY (VIT D DEFICIENCY, FRACTURES): Vit D, 25-Hydroxy: 24 ng/mL — ABNORMAL LOW (ref 30–100)

## 2018-11-30 ENCOUNTER — Ambulatory Visit (INDEPENDENT_AMBULATORY_CARE_PROVIDER_SITE_OTHER): Payer: BLUE CROSS/BLUE SHIELD | Admitting: Osteopathic Medicine

## 2018-11-30 VITALS — BP 114/73 | HR 62 | Temp 97.7°F | Wt 208.0 lb

## 2018-11-30 DIAGNOSIS — Z23 Encounter for immunization: Secondary | ICD-10-CM | POA: Diagnosis not present

## 2018-11-30 NOTE — Progress Notes (Signed)
  Pt in today for shingrix vaccine. This is 1 of 2 of the shingrix series. Vitals taken and no fever noted. Vaccine was given in l deltoid. Pt tolerated well with no immediate complications.

## 2019-02-20 ENCOUNTER — Ambulatory Visit (INDEPENDENT_AMBULATORY_CARE_PROVIDER_SITE_OTHER): Payer: BLUE CROSS/BLUE SHIELD | Admitting: Osteopathic Medicine

## 2019-02-20 ENCOUNTER — Encounter: Payer: Self-pay | Admitting: Osteopathic Medicine

## 2019-02-20 VITALS — BP 116/74 | HR 87 | Temp 98.4°F | Wt 202.2 lb

## 2019-02-20 DIAGNOSIS — K029 Dental caries, unspecified: Secondary | ICD-10-CM | POA: Diagnosis not present

## 2019-02-20 DIAGNOSIS — E119 Type 2 diabetes mellitus without complications: Secondary | ICD-10-CM

## 2019-02-20 DIAGNOSIS — K0510 Chronic gingivitis, plaque induced: Secondary | ICD-10-CM

## 2019-02-20 LAB — POCT GLYCOSYLATED HEMOGLOBIN (HGB A1C): Hemoglobin A1C: 7.3 % — AB (ref 4.0–5.6)

## 2019-02-20 MED ORDER — AMOXICILLIN 500 MG PO TABS: 500.0000 mg | ORAL_TABLET | Freq: Two times a day (BID) | ORAL | 0 refills | Status: AC

## 2019-02-20 NOTE — Progress Notes (Signed)
HPI: Paul Reed is a 63 y.o. male who  has a past medical history of Bursitis, Chronic kidney disease, ELEVATED PROSTATE SPECIFIC ANTIGEN (10/23/2008), Hypothyroidism, Impaired fasting glucose, Obesity, and OSA on CPAP.  he presents to [facility] today, 02/20/19,  for chief complaint of: A1C recheck Dental pain   R jaw/tooth pain x3 days, took pain medicines (wife's rx) and tylenol. Has not talked to dentist yet.   DM2:  Last A1C 11/2018 was 7.3. Other labs ok, LDL at goal, no Cr elevation. Today stable at 7.3.  Patient requests that we hold off on medication adjustment, he notes significant room for improvement as far as diet/exercise is concerned.      Past medical, surgical, social and family history reviewed:  Patient Active Problem List   Diagnosis Date Noted  . Vitamin D deficiency 11/16/2017  . Hyperlipidemia associated with type 2 diabetes mellitus ([place]) 11/16/2017  . Elevated liver enzymes 11/16/2017  . Annual physical exam 09/11/2016  . Solitary pulmonary nodule 01/06/2016  . Type 2 diabetes mellitus ([place]) 10/29/2015  . Hyperglycemia 10/28/2015  . Hypertriglyceridemia 02/08/2013  . CALF PAIN, LEFT 10/30/2010  . Elevated PSA 10/23/2008  . Hypothyroidism 06/01/2007  . OBESITY NOS 06/01/2007  . NEPHROLITHIASIS 06/01/2007    Past Surgical History:  Procedure Laterality Date  . ESOPHAGUS SURGERY     Removed glass in infancy    Social History   Tobacco Use  . Smoking status: Never Smoker  . Smokeless tobacco: Never Used  Substance Use Topics  . Alcohol use: Yes    Comment: very infrequent    Family History  Problem Relation Age of Onset  . Cancer Father   . Thyroid disease Unknown        mother     Current medication list and allergy/intolerance information reviewed:    Current Outpatient Medications  Medication Sig Dispense Refill  . atorvastatin (LIPITOR) 40 MG tablet Take 1 tablet (40 mg total) by mouth  daily. 90 tablet 3  . blood glucose meter kit and supplies KIT Dispense based on patient and insurance preference. Use up to four times daily as directed. Please include lancets, test strips, control solution. 1 each prn  . empagliflozin (JARDIANCE) 25 MG TABS tablet Take 25 mg by mouth daily. 90 tablet 3  . levothyroxine (SYNTHROID, LEVOTHROID) 112 MCG tablet TAKE 1 TABLET(112 MCG) BY MOUTH DAILY BEFORE BREAKFAST 90 tablet 3  . metFORMIN (GLUCOPHAGE) 1000 MG tablet Take 1 tablet (1,000 mg total) by mouth 2 (two) times daily with a meal. (Start 1/2 tablet once daily, increase by 1/2 tablet as tolerated) 180 tablet 3  . amoxicillin (AMOXIL) 500 MG tablet Take 1 tablet (500 mg total) by mouth 2 (two) times daily for 7 days. 14 tablet 0   No current facility-administered medications for this visit.     No Known Allergies    Review of Systems:  Constitutional:  No  fever, no chills, No recent illness  HEENT: No  headache, no vision change, dental pain as per HPI  Cardiac: No  chest pain, No  pressure  Respiratory:  No  shortness of breath. No  Cough  Endocrine: No cold intolerance,  No heat intolerance. No polyuria/polydipsia/polyphagia   Neurologic: No  weakness, No  dizziness   Exam:  BP 116/74 (BP Location: Left Arm, Patient Position: Sitting, Cuff Size: Normal)   Pulse 87   Temp 98.4 F (36.9 C) (Oral)   Wt 202 lb 3.2 oz (91.7  kg)   BMI 31.67 kg/m   Constitutional: VS see above. General Appearance: alert, well-developed, well-nourished, NAD  Eyes: Normal lids and conjunctive, non-icteric sclera  Ears, Nose, Mouth, Throat: MMM, Normal external inspection ears/nares/mouth/lips.  Fair dentition, significant tartar/plaque buildup, mild gingivitis on right lower molar, no abscess or drainage.Marland Kitchen Pharynx/tonsils no erythema, no exudate.  Neck: No masses, trachea midline. No thyroid enlargement. No tenderness/mass appreciated. No lymphadenopathy  Respiratory: Normal respiratory  effort.   Cardiovascular: S1/S2 normal, no murmur, no rub/gallop auscultated. RRR. No lower extremity edema.  Skin: warm, dry, intact.     Results for orders placed or performed in visit on 02/20/19 (from the past 24 hour(s))  POCT HgB A1C     Status: Abnormal   Collection Time: 02/20/19 11:37 AM  Result Value Ref Range   Hemoglobin A1C 7.3 (A) 4.0 - 5.6 %   HbA1c POC (<> result, manual entry)     HbA1c, POC (prediabetic range)     HbA1c, POC (controlled diabetic range)          ASSESSMENT/PLAN: The primary encounter diagnosis was Type 2 diabetes mellitus without complication, without long-term current use of insulin ([place]). Diagnoses of Pain due to dental caries and Gingivitis were also pertinent to this visit.   Orders Placed This Encounter  Procedures  . POCT HgB A1C    Meds ordered this encounter  Medications  . amoxicillin (AMOXIL) 500 MG tablet    Sig: Take 1 tablet (500 mg total) by mouth 2 (two) times daily for 7 days.    Dispense:  14 tablet    Refill:  0   Will treat for possible dental infection, patient is advised that he really needs to follow-up with a dentist about this, x-rays might be needed  Pt would like to hold off on medication change for now, would like to try diet/exercise since he hasn't been doing this at all.       Visit summary with medication list and pertinent instructions was printed for patient to review. All questions at time of visit were answered - patient instructed to contact office with any additional concerns or updates. ER/RTC precautions were reviewed with the patient.    Please note: voice recognition software was used to produce this document, and typos may escape review. Please contact Dr. Sheppard Coil for any needed clarifications.     Follow-up plan: Return in about 4 months (around 06/22/2019) for recheck A1C .

## 2019-03-20 ENCOUNTER — Other Ambulatory Visit: Payer: Self-pay | Admitting: Osteopathic Medicine

## 2019-03-20 DIAGNOSIS — E039 Hypothyroidism, unspecified: Secondary | ICD-10-CM

## 2019-04-26 ENCOUNTER — Other Ambulatory Visit: Payer: Self-pay | Admitting: Osteopathic Medicine

## 2019-04-26 DIAGNOSIS — E119 Type 2 diabetes mellitus without complications: Secondary | ICD-10-CM

## 2019-05-01 ENCOUNTER — Other Ambulatory Visit: Payer: Self-pay | Admitting: Osteopathic Medicine

## 2019-05-01 DIAGNOSIS — E119 Type 2 diabetes mellitus without complications: Secondary | ICD-10-CM

## 2019-06-18 ENCOUNTER — Ambulatory Visit: Payer: BLUE CROSS/BLUE SHIELD | Admitting: Osteopathic Medicine

## 2019-06-19 ENCOUNTER — Ambulatory Visit: Payer: BLUE CROSS/BLUE SHIELD | Admitting: Osteopathic Medicine

## 2019-08-06 ENCOUNTER — Other Ambulatory Visit: Payer: Self-pay | Admitting: Osteopathic Medicine

## 2019-08-06 DIAGNOSIS — E119 Type 2 diabetes mellitus without complications: Secondary | ICD-10-CM

## 2019-08-06 NOTE — Telephone Encounter (Signed)
Please advise 

## 2019-09-03 ENCOUNTER — Other Ambulatory Visit: Payer: Self-pay | Admitting: Osteopathic Medicine

## 2019-09-03 NOTE — Telephone Encounter (Signed)
Needs appointment

## 2019-10-08 ENCOUNTER — Other Ambulatory Visit: Payer: Self-pay | Admitting: Osteopathic Medicine

## 2019-10-08 NOTE — Telephone Encounter (Signed)
Patient needs a DM follow up appointment.

## 2019-10-08 NOTE — Telephone Encounter (Signed)
Requested medication (s) are due for refill today: yes  Requested medication (s) are on the active medication list: yes  Last refill:  09/03/2019  Future visit scheduled: no  Notes to clinic: overdue for office visit Review for refill   Requested Prescriptions  Pending Prescriptions Disp Refills   metFORMIN (GLUCOPHAGE) 1000 MG tablet [Pharmacy Med Name: METFORMIN 1000MG TABLETS] 30 tablet 0    Sig: TAKE 1 TABLET(1000 MG) BY MOUTH DAILY WITH BREAKFAST     Endocrinology:  Diabetes - Biguanides Failed - 10/08/2019  8:28 AM      Failed - HBA1C is between 0 and 7.9 and within 180 days    Hemoglobin A1C  Date Value Ref Range Status  02/20/2019 7.3 (A) 4.0 - 5.6 % Final   Hgb A1c MFr Bld  Date Value Ref Range Status  11/21/2018 7.3 (H) <5.7 % of total Hgb Final    Comment:    For someone without known diabetes, a hemoglobin A1c value of 6.5% or greater indicates that they may have  diabetes and this should be confirmed with a follow-up  test. . For someone with known diabetes, a value <7% indicates  that their diabetes is well controlled and a value  greater than or equal to 7% indicates suboptimal  control. A1c targets should be individualized based on  duration of diabetes, age, comorbid conditions, and  other considerations. . Currently, no consensus exists regarding use of hemoglobin A1c for diagnosis of diabetes for children. .          Failed - Valid encounter within last 6 months    Recent Outpatient Visits          7 months ago Type 2 diabetes mellitus without complication, without long-term current use of insulin ([place])   [place] [facility], [place], DO   10 months ago Need for shingles vaccine   [facility], Lanelle Bal, DO   10 months ago Annual physical exam   [facility], Lanelle Bal, DO   1 year ago Type 2 diabetes mellitus without  complication, without long-term current use of insulin [facility])   [place] [facility], Lanelle Bal, DO   1 year ago Type 2 diabetes mellitus without complication, without long-term current use of insulin [facility])   [place] [facility], Lanelle Bal, DO             Passed - Cr in normal range and within 360 days    Creat  Date Value Ref Range Status  11/21/2018 0.94 0.70 - 1.25 mg/dL Final    Comment:    For patients >29 years of age, the reference limit for Creatinine is approximately 13% higher for people identified as African-American. .          Passed - eGFR in normal range and within 360 days    GFR, Est African American  Date Value Ref Range Status  11/21/2018 102 > OR = 60 mL/min/1.65m Final   GFR, Est Non African American  Date Value Ref Range Status  11/21/2018 88 > OR = 60 mL/min/1.775mFinal

## 2019-10-24 ENCOUNTER — Encounter: Payer: Self-pay | Admitting: Osteopathic Medicine

## 2019-10-24 ENCOUNTER — Ambulatory Visit (INDEPENDENT_AMBULATORY_CARE_PROVIDER_SITE_OTHER): Payer: BLUE CROSS/BLUE SHIELD | Admitting: Osteopathic Medicine

## 2019-10-24 ENCOUNTER — Other Ambulatory Visit: Payer: Self-pay

## 2019-10-24 VITALS — BP 126/83 | HR 79 | Temp 98.3°F | Wt 206.0 lb

## 2019-10-24 DIAGNOSIS — E119 Type 2 diabetes mellitus without complications: Secondary | ICD-10-CM | POA: Diagnosis not present

## 2019-10-24 DIAGNOSIS — R972 Elevated prostate specific antigen [PSA]: Secondary | ICD-10-CM

## 2019-10-24 DIAGNOSIS — Z1211 Encounter for screening for malignant neoplasm of colon: Secondary | ICD-10-CM | POA: Diagnosis not present

## 2019-10-24 DIAGNOSIS — E1169 Type 2 diabetes mellitus with other specified complication: Secondary | ICD-10-CM

## 2019-10-24 DIAGNOSIS — E785 Hyperlipidemia, unspecified: Secondary | ICD-10-CM

## 2019-10-24 DIAGNOSIS — E039 Hypothyroidism, unspecified: Secondary | ICD-10-CM

## 2019-10-24 LAB — POCT GLYCOSYLATED HEMOGLOBIN (HGB A1C): Hemoglobin A1C: 6.9 % — AB (ref 4.0–5.6)

## 2019-10-24 MED ORDER — LEVOTHYROXINE SODIUM 112 MCG PO TABS: 112.0000 ug | ORAL_TABLET | Freq: Every day | ORAL | 1 refills | Status: DC

## 2019-10-24 MED ORDER — METFORMIN HCL 1000 MG PO TABS: 1000.0000 mg | ORAL_TABLET | Freq: Every day | ORAL | 1 refills | Status: DC

## 2019-10-24 NOTE — Patient Instructions (Addendum)
Will continue current medications Plan to recheck A1C in 6 months and check labs  Call your urologist!!!

## 2019-10-24 NOTE — Progress Notes (Signed)
Virtual Visit via Video (App used: Doximity) Note  I connected with      Paul Reed on 10/24/19 at 3:26 PM  by a telemedicine application and verified that I am speaking with the correct person using two identifiers.  Patient is in the office I am working from home   I discussed the limitations of evaluation and management by telemedicine and the availability of in person appointments. The patient expressed understanding and agreed to proceed.  History of Present Illness: Paul Reed is a 63 y.o. male who would like to discuss  Chief Complaint  Patient presents with  . Diabetes  . Medication Refill     DIABETES SCREENING/PREVENTIVE CARE: A1C past 3-6 mos: Yes  controlled? Yes   02/20/19: 7.3%  Today 10/24/19 6.9% better! [name]!    BP goal <130/80: Yes   BP Readings from Last 3 Encounters:  10/24/19 126/83  02/20/19 116/74  11/30/18 114/73   LDL goal <70: need to update labs   Eye exam annually: none on file, importance discussed with patient Foot exam: needs but i'm at home!  Microalbuminuria:will check next visit Metformin: Yes  ACE/ARB: No  Antiplatelet if ASCVD Risk >10%: No  Statin: has declined Pneumovax: Yes  Immunization History  Administered Date(s) Administered  . Influenza Split 10/12/2011, 10/10/2012  . Influenza Whole 10/12/2010  . Influenza, Quadrivalent, Recombinant, Inj, Pf 10/19/2019  . Influenza, Seasonal, Injecte, Preservative Fre 08/09/2013  . Influenza,inj,Quad PF,6+ Mos 09/06/2014, 10/28/2015, 09/10/2016, 09/15/2017, 11/21/2018  . Pneumococcal Polysaccharide-23 11/21/2018  . Td 06/04/2008  . Zoster Recombinat (Shingrix) 11/30/2018          Observations/Objective: BP 126/83 (BP Location: Right Arm, Patient Position: Sitting, Cuff Size: Normal)   Pulse 79   Temp 98.3 F (36.8 C) (Oral)   Wt 206 lb 0.6 oz (93.5 kg)   BMI 32.27 kg/m  BP Readings from Last 3 Encounters:  10/24/19 126/83  02/20/19 116/74  11/30/18  114/73   Exam: Normal Speech.  NAD  Lab and Radiology Results Results for orders placed or performed in visit on 10/24/19 (from the past 72 hour(s))  POCT HgB A1C     Status: Abnormal   Collection Time: 10/24/19  3:18 PM  Result Value Ref Range   Hemoglobin A1C 6.9 (A) 4.0 - 5.6 %   HbA1c POC (<> result, manual entry)     HbA1c, POC (prediabetic range)     HbA1c, POC (controlled diabetic range)     No results found.     Assessment and Plan: 63 y.o. male with The primary encounter diagnosis was Type 2 diabetes mellitus without complication, without long-term current use of insulin ([place]). Diagnoses of Colon cancer screening, Elevated PSA, Hyperlipidemia associated with type 2 diabetes mellitus ([place]), Hypothyroidism, unspecified type, and Hypothyroidism were also pertinent to this visit.  Emphasized importance of urology follow-up! He declines PSA recheck at this time. Last was 7+ almost a year ago! He needs to call them!!!  PDMP not reviewed this encounter. Orders Placed This Encounter  Procedures  . POCT HgB A1C   Meds ordered this encounter  Medications  . metFORMIN (GLUCOPHAGE) 1000 MG tablet    Sig: Take 1 tablet (1,000 mg total) by mouth daily.    Dispense:  90 tablet    Refill:  1  . levothyroxine (SYNTHROID) 112 MCG tablet    Sig: Take 1 tablet (112 mcg total) by mouth daily before breakfast.    Dispense:  90 tablet    Refill:  1   Patient Instructions  Will continue current medications Plan to recheck A1C in 6 months and check labs  Call your urologist!!!    Instructions printed via AVS in office   Follow Up Instructions: Return in about 6 months (around 04/22/2020) for [place] (get labs prior to visit, orders are in).    I discussed the assessment and treatment plan with the patient. The patient was provided an opportunity to ask questions and all were answered. The patient agreed with the plan and demonstrated an understanding of the instructions.   The  patient was advised to call back or seek an in-person evaluation if any new concerns, if symptoms worsen or if the condition fails to improve as anticipated.  25 minutes of non-face-to-face time was provided during this encounter.                      Historical information moved to improve visibility of documentation.  Past Medical History:  Diagnosis Date  . Bursitis   . Chronic kidney disease    stones  . ELEVATED PROSTATE SPECIFIC ANTIGEN 10/23/2008   Biopsy planned with Dr. Pilar Jarvis ([facility])    . Hypothyroidism   . Impaired fasting glucose   . Obesity   . OSA on CPAP    15 years   Past Surgical History:  Procedure Laterality Date  . ESOPHAGUS SURGERY     Removed glass in infancy   Social History   Tobacco Use  . Smoking status: Never Smoker  . Smokeless tobacco: Never Used  Substance Use Topics  . Alcohol use: Yes    Comment: very infrequent   family history includes Cancer in his father; Thyroid disease in his unknown relative.  Medications: Current Outpatient Medications  Medication Sig Dispense Refill  . atorvastatin (LIPITOR) 40 MG tablet TAKE 1 TABLET(40 MG) BY MOUTH DAILY 90 tablet 0  . blood glucose meter kit and supplies KIT Dispense based on patient and insurance preference. Use up to four times daily as directed. Please include lancets, test strips, control solution. 1 each prn  . JARDIANCE 25 MG TABS tablet TAKE 1 TABLET BY MOUTH DAILY 90 tablet 3  . levothyroxine (SYNTHROID, LEVOTHROID) 112 MCG tablet TAKE 1 TABLET(112 MCG) BY MOUTH DAILY BEFORE BREAKFAST 90 tablet 3  . metFORMIN (GLUCOPHAGE) 1000 MG tablet TAKE 1 TABLET(1000 MG) BY MOUTH DAILY WITH BREAKFAST 15 tablet 0   No current facility-administered medications for this visit.    No Known Allergies  PDMP not reviewed this encounter. Orders Placed This Encounter  Procedures  . POCT HgB A1C   No orders of the defined types were placed in this encounter.

## 2019-11-08 LAB — COLOGUARD: Cologuard: NEGATIVE

## 2019-11-12 ENCOUNTER — Other Ambulatory Visit: Payer: Self-pay

## 2019-11-12 DIAGNOSIS — E119 Type 2 diabetes mellitus without complications: Secondary | ICD-10-CM

## 2019-11-12 MED ORDER — ATORVASTATIN CALCIUM 40 MG PO TABS: ORAL_TABLET | ORAL | 1 refills | Status: DC

## 2019-11-21 ENCOUNTER — Encounter: Payer: Self-pay | Admitting: Osteopathic Medicine

## 2020-04-14 ENCOUNTER — Other Ambulatory Visit: Payer: Self-pay

## 2020-04-14 MED ORDER — METFORMIN HCL 1000 MG PO TABS: 1000.0000 mg | ORAL_TABLET | Freq: Every day | ORAL | 0 refills | Status: DC

## 2020-04-28 ENCOUNTER — Encounter: Payer: Self-pay | Admitting: Osteopathic Medicine

## 2020-04-28 ENCOUNTER — Ambulatory Visit (INDEPENDENT_AMBULATORY_CARE_PROVIDER_SITE_OTHER): Payer: BC Managed Care – PPO | Admitting: Osteopathic Medicine

## 2020-04-28 VITALS — BP 125/81 | HR 80 | Temp 98.0°F | Wt 202.0 lb

## 2020-04-28 DIAGNOSIS — E785 Hyperlipidemia, unspecified: Secondary | ICD-10-CM

## 2020-04-28 DIAGNOSIS — R972 Elevated prostate specific antigen [PSA]: Secondary | ICD-10-CM | POA: Diagnosis not present

## 2020-04-28 DIAGNOSIS — E559 Vitamin D deficiency, unspecified: Secondary | ICD-10-CM

## 2020-04-28 DIAGNOSIS — E039 Hypothyroidism, unspecified: Secondary | ICD-10-CM

## 2020-04-28 DIAGNOSIS — Z0000 Encounter for general adult medical examination without abnormal findings: Secondary | ICD-10-CM

## 2020-04-28 DIAGNOSIS — E119 Type 2 diabetes mellitus without complications: Secondary | ICD-10-CM

## 2020-04-28 DIAGNOSIS — E1169 Type 2 diabetes mellitus with other specified complication: Secondary | ICD-10-CM

## 2020-04-28 LAB — POCT GLYCOSYLATED HEMOGLOBIN (HGB A1C): Hemoglobin A1C: 7.2 % — AB (ref 4.0–5.6)

## 2020-04-28 NOTE — Progress Notes (Signed)
Paul Reed is a 64 y.o. male who presents to  [facility]  today, 04/28/20, seeking care for the following: . Annual Physical . Recheck labs    Labs reviewed:  10/2019: A1C 6.9 Ordered PSA etc never done  11/2018: PSA 7.1 A1C 7.3  02/2018 A1V 7.0  01/2018: PSA 6.3      ASSESSMENT & PLAN with other pertinent history/findings:  The primary encounter diagnosis was Annual physical exam. Diagnoses of Type 2 diabetes mellitus without complication, without long-term current use of insulin ([place]), Hypothyroidism, unspecified type, Elevated PSA, Hyperlipidemia associated with type 2 diabetes mellitus ([place]), and Vitamin D deficiency were also pertinent to this visit.    Patient Instructions  Plan: Labs ASAP!  Refills sent   General Preventive Care  Most recent routine screening labs: ordered.   Everyone should have blood pressure checked once per year. Goal 130/80 or less.   Tobacco: don't!   Alcohol: responsible moderation is ok for most adults - if you have concerns about your alcohol intake, please talk to me!   Exercise: as tolerated to reduce risk of cardiovascular disease and diabetes. Strength training will also prevent osteoporosis.   Mental health: if need for mental health care (medicines, counseling, other), or concerns about moods, please let me know!   Sexual health: if need for STD testing, or if concerns with libido/pain problems, please let me know!  Advanced Directive: Living Will and/or Healthcare Power of Attorney recommended for all adults, regardless of age or health.  Vaccines  Flu vaccine: for almost everyone, every fall.   Shingles vaccine: after age 11. We have 1 shot on file but this is a 2-shot series!   Pneumonia vaccines: boosters due after age 13  Tetanus booster: every 10 years - will update next visit / sooner if serious cut or puncture wound   COVID vaccine: thanks!   Cancer screenings   Colon cancer screening: for everyone age 95-75. Cologuard stool test due to repeat 10/2022  Prostate cancer screening: UROLOGY!!!  Lung cancer screening: not needed for non-smokers  Infection screenings  . HIV: recommended screening at least once age 52-65, more often as needed. . Gonorrhea/Chlamydia: screening as needed . Hepatitis C: recommended once for everyone age 29-75 . TB: certain at-risk populations, or depending on work requirements and/or travel history Other . Bone Density Test: recommended for men at age 71, sooner depending on risk factors . Abdominal Aortic Aneurysm: screening with ultrasound recommended once for men age 10-75 who have ever smoked     Orders Placed This Encounter  Procedures  . CBC  . COMPLETE METABOLIC PANEL WITH GFR  . Lipid panel  . TSH  . PSA, Total with Reflex to PSA, Free  . POCT HgB A1C   Results for orders placed or performed in visit on 04/28/20 (from the past 24 hour(s))  POCT HgB A1C     Status: Abnormal   Collection Time: 04/28/20  1:58 PM  Result Value Ref Range   Hemoglobin A1C 7.2 (A) 4.0 - 5.6 %   HbA1c POC (<> result, manual entry)     HbA1c, POC (prediabetic range)     HbA1c, POC (controlled diabetic range)      No orders of the defined types were placed in this encounter.     . Shingrix and Tdap next visit . NEEDS TO SEE UROLOGY - advised PSA is a concern    Constitutional:  . VSS, see nurse  notes . General Appearance: alert, well-developed, well-nourished, NAD Eyes: Marland Kitchen Normal lids and conjunctive, non-icteric sclera Ears, Nose, Mouth, Throat: . Normal appearance . Mask in place over nose/mouth Neck: . No masses, trachea midline . No thyroid enlargement/tenderness/mass appreciated Respiratory: . Normal respiratory effort . Breath sounds normal, no wheeze/rhonchi/rales Cardiovascular: . S1/S2 normal, no murmur/rub/gallop auscultated . No lower extremity  edema Gastrointestinal: . Nontender, no masses . No hepatomegaly, no splenomegaly . No hernia appreciated Musculoskeletal:  . Gait normal . No clubbing/cyanosis of digits Neurological: . No cranial nerve deficit on limited exam . Motor and sensation intact and symmetric Psychiatric: . Normal judgment/insight . Normal mood and affect      Follow-up instructions: Return in about 4 months (around 08/29/2020) for ROUTINE CHECK-UP A1C .                                         BP 125/81 (BP Location: Left Arm, Patient Position: Sitting, Cuff Size: Normal)   Pulse 80   Temp 98 F (36.7 C) (Oral)   Wt 202 lb 0.6 oz (91.6 kg)   BMI 31.64 kg/m   Current Meds  Medication Sig  . atorvastatin (LIPITOR) 40 MG tablet TAKE 1 TABLET(40 MG) BY MOUTH DAILY  . blood glucose meter kit and supplies KIT Dispense based on patient and insurance preference. Use up to four times daily as directed. Please include lancets, test strips, control solution.  Marland Kitchen JARDIANCE 25 MG TABS tablet TAKE 1 TABLET BY MOUTH DAILY  . levothyroxine (SYNTHROID) 112 MCG tablet Take 1 tablet (112 mcg total) by mouth daily before breakfast.  . metFORMIN (GLUCOPHAGE) 1000 MG tablet Take 1 tablet (1,000 mg total) by mouth daily.    Results for orders placed or performed in visit on 04/28/20 (from the past 72 hour(s))  POCT HgB A1C     Status: Abnormal   Collection Time: 04/28/20  1:58 PM  Result Value Ref Range   Hemoglobin A1C 7.2 (A) 4.0 - 5.6 %   HbA1c POC (<> result, manual entry)     HbA1c, POC (prediabetic range)     HbA1c, POC (controlled diabetic range)      No results found.  Depression screen [facility] 2/9 04/28/2020 10/24/2019 02/20/2019  Decreased Interest 0 0 0  Down, Depressed, Hopeless 0 0 0  PHQ - 2 Score 0 0 0  Altered sleeping 1 1 2   Tired, decreased energy 1 1 2   Change in appetite 0 0 0  Feeling bad or failure about yourself  0 0 0  Trouble concentrating 1 0 0   Moving slowly or fidgety/restless 0 0 0  Suicidal thoughts 0 0 0  PHQ-9 Score 3 2 4   Difficult doing work/chores Somewhat difficult Not difficult at all -    GAD 7 : Generalized Anxiety Score 04/28/2020 10/24/2019 02/20/2019  Nervous, Anxious, on Edge 0 0 0  Control/stop worrying 0 0 0  Worry too much - different things 1 0 0  Trouble relaxing 0 0 0  Restless 0 0 0  Easily annoyed or irritable 0 0 0  Afraid - awful might happen 0 0 0  Total GAD 7 Score 1 0 0  Anxiety Difficulty Not difficult at all Not difficult at all -      All questions at time of visit were answered - patient instructed to contact office with any additional concerns or updates.  ER/RTC precautions were reviewed with the patient.  Please note: voice recognition software was used to produce this document, and typos may escape review. Please contact Dr. Sheppard Coil for any needed clarifications.

## 2020-04-28 NOTE — Patient Instructions (Addendum)
Plan: Labs ASAP!  Refills sent   General Preventive Care  Most recent routine screening labs: ordered.   Everyone should have blood pressure checked once per year. Goal 130/80 or less.   Tobacco: don't!   Alcohol: responsible moderation is ok for most adults - if you have concerns about your alcohol intake, please talk to me!   Exercise: as tolerated to reduce risk of cardiovascular disease and diabetes. Strength training will also prevent osteoporosis.   Mental health: if need for mental health care (medicines, counseling, other), or concerns about moods, please let me know!   Sexual health: if need for STD testing, or if concerns with libido/pain problems, please let me know!  Advanced Directive: Living Will and/or Healthcare Power of Attorney recommended for all adults, regardless of age or health.  Vaccines  Flu vaccine: for almost everyone, every fall.   Shingles vaccine: after age 94. We have 1 shot on file but this is a 2-shot series!   Pneumonia vaccines: boosters due after age 92  Tetanus booster: every 10 years - will update next visit / sooner if serious cut or puncture wound   COVID vaccine: thanks!  Cancer screenings   Colon cancer screening: for everyone age 44-75. Cologuard stool test due to repeat 10/2022  Prostate cancer screening: UROLOGY!!!  Lung cancer screening: not needed for non-smokers  Infection screenings  . HIV: recommended screening at least once age 60-65, more often as needed. . Gonorrhea/Chlamydia: screening as needed . Hepatitis C: recommended once for everyone age 80-75 . TB: certain at-risk populations, or depending on work requirements and/or travel history Other . Bone Density Test: recommended for men at age 37, sooner depending on risk factors . Abdominal Aortic Aneurysm: screening with ultrasound recommended once for men age 83-75 who have ever smoked

## 2020-05-01 DIAGNOSIS — E039 Hypothyroidism, unspecified: Secondary | ICD-10-CM | POA: Diagnosis not present

## 2020-05-01 DIAGNOSIS — E1169 Type 2 diabetes mellitus with other specified complication: Secondary | ICD-10-CM | POA: Diagnosis not present

## 2020-05-01 DIAGNOSIS — R972 Elevated prostate specific antigen [PSA]: Secondary | ICD-10-CM | POA: Diagnosis not present

## 2020-05-01 DIAGNOSIS — E785 Hyperlipidemia, unspecified: Secondary | ICD-10-CM | POA: Diagnosis not present

## 2020-05-01 DIAGNOSIS — E119 Type 2 diabetes mellitus without complications: Secondary | ICD-10-CM | POA: Diagnosis not present

## 2020-05-02 ENCOUNTER — Encounter: Payer: Self-pay | Admitting: Osteopathic Medicine

## 2020-05-05 LAB — COMPLETE METABOLIC PANEL WITH GFR
AG Ratio: 1.5 (calc) (ref 1.0–2.5)
ALT: 24 U/L (ref 9–46)
AST: 20 U/L (ref 10–35)
Albumin: 4.7 g/dL (ref 3.6–5.1)
Alkaline phosphatase (APISO): 83 U/L (ref 35–144)
BUN: 18 mg/dL (ref 7–25)
CO2: 29 mmol/L (ref 20–32)
Calcium: 9.5 mg/dL (ref 8.6–10.3)
Chloride: 103 mmol/L (ref 98–110)
Creat: 0.87 mg/dL (ref 0.70–1.25)
GFR, Est African American: 107 mL/min/{1.73_m2} (ref >=60)
GFR, Est Non African American: 92 mL/min/{1.73_m2} (ref >=60)
Globulin: 3.1 g/dL (calc) (ref 1.9–3.7)
Glucose, Bld: 138 mg/dL — ABNORMAL HIGH (ref 65–99)
Potassium: 4.7 mmol/L (ref 3.5–5.3)
Sodium: 139 mmol/L (ref 135–146)
Total Bilirubin: 0.6 mg/dL (ref 0.2–1.2)
Total Protein: 7.8 g/dL (ref 6.1–8.1)

## 2020-05-05 LAB — CBC
HCT: 49.4 % (ref 38.5–50.0)
Hemoglobin: 16.3 g/dL (ref 13.2–17.1)
MCH: 30.4 pg (ref 27.0–33.0)
MCHC: 33 g/dL (ref 32.0–36.0)
MCV: 92 fL (ref 80.0–100.0)
MPV: 9.8 fL (ref 7.5–12.5)
Platelets: 306 10*3/uL (ref 140–400)
RBC: 5.37 10*6/uL (ref 4.20–5.80)
RDW: 12 % (ref 11.0–15.0)
WBC: 8.8 10*3/uL (ref 3.8–10.8)

## 2020-05-05 LAB — LIPID PANEL
Cholesterol: 96 mg/dL (ref <200)
HDL: 47 mg/dL (ref >=40)
LDL Cholesterol (Calc): 31 mg/dL (calc)
Non-HDL Cholesterol (Calc): 49 mg/dL (calc) (ref <130)
Total CHOL/HDL Ratio: 2 (calc) (ref <5.0)
Triglycerides: 97 mg/dL (ref <150)

## 2020-05-05 LAB — REFLEX PSA, FREE
PSA, % Free: 24 % (calc) — ABNORMAL LOW (ref >25)
PSA, Free: 1.3 ng/mL

## 2020-05-05 LAB — TSH: TSH: 0.41 mIU/L (ref 0.40–4.50)

## 2020-05-05 LAB — PSA, TOTAL WITH REFLEX TO PSA, FREE: PSA, Total: 5.4 ng/mL — ABNORMAL HIGH (ref <=4.0)

## 2020-05-26 ENCOUNTER — Other Ambulatory Visit: Payer: Self-pay

## 2020-05-26 DIAGNOSIS — E119 Type 2 diabetes mellitus without complications: Secondary | ICD-10-CM

## 2020-05-26 MED ORDER — EMPAGLIFLOZIN 25 MG PO TABS: 25.0000 mg | ORAL_TABLET | Freq: Every day | ORAL | 3 refills | Status: DC

## 2020-06-02 ENCOUNTER — Other Ambulatory Visit: Payer: Self-pay

## 2020-06-02 DIAGNOSIS — E119 Type 2 diabetes mellitus without complications: Secondary | ICD-10-CM

## 2020-06-02 MED ORDER — ATORVASTATIN CALCIUM 40 MG PO TABS: 40.0000 mg | ORAL_TABLET | Freq: Every day | ORAL | 0 refills | Status: DC

## 2020-06-04 ENCOUNTER — Telehealth: Payer: Self-pay | Admitting: Osteopathic Medicine

## 2020-06-04 NOTE — Telephone Encounter (Signed)
Received fax for PA on Jardiance sent through cover my meds waiting on determination. - CF °

## 2020-06-05 ENCOUNTER — Other Ambulatory Visit: Payer: Self-pay

## 2020-06-05 MED ORDER — METFORMIN HCL 1000 MG PO TABS: 1000.0000 mg | ORAL_TABLET | Freq: Every day | ORAL | 0 refills | Status: DC

## 2020-06-09 NOTE — Telephone Encounter (Signed)
Received fax from [facility] stating medication does not required a PA - CF

## 2020-07-03 ENCOUNTER — Telehealth: Payer: Self-pay

## 2020-07-03 NOTE — Telephone Encounter (Signed)
Patient called wanting to know the status if PA that was prescribed 06/03/2020. Please advise

## 2020-09-02 ENCOUNTER — Other Ambulatory Visit: Payer: Self-pay

## 2020-09-02 ENCOUNTER — Encounter: Payer: Self-pay | Admitting: Osteopathic Medicine

## 2020-09-02 ENCOUNTER — Ambulatory Visit (INDEPENDENT_AMBULATORY_CARE_PROVIDER_SITE_OTHER): Payer: BC Managed Care – PPO | Admitting: Osteopathic Medicine

## 2020-09-02 VITALS — BP 106/70 | HR 65 | Wt 211.0 lb

## 2020-09-02 DIAGNOSIS — E119 Type 2 diabetes mellitus without complications: Secondary | ICD-10-CM | POA: Diagnosis not present

## 2020-09-02 DIAGNOSIS — E039 Hypothyroidism, unspecified: Secondary | ICD-10-CM | POA: Diagnosis not present

## 2020-09-02 DIAGNOSIS — E1169 Type 2 diabetes mellitus with other specified complication: Secondary | ICD-10-CM | POA: Diagnosis not present

## 2020-09-02 DIAGNOSIS — Z23 Encounter for immunization: Secondary | ICD-10-CM

## 2020-09-02 DIAGNOSIS — E785 Hyperlipidemia, unspecified: Secondary | ICD-10-CM

## 2020-09-02 LAB — POCT GLYCOSYLATED HEMOGLOBIN (HGB A1C)
HbA1c POC (<> result, manual entry): 7.8 % (ref 4.0–5.6)
HbA1c, POC (controlled diabetic range): 7.8 % — AB (ref 0.0–7.0)
HbA1c, POC (prediabetic range): 7.8 % — AB (ref 5.7–6.4)
Hemoglobin A1C: 7.8 % — AB (ref 4.0–5.6)

## 2020-09-02 MED ORDER — LEVOTHYROXINE SODIUM 112 MCG PO TABS: 112.0000 ug | ORAL_TABLET | Freq: Every day | ORAL | 3 refills | Status: DC

## 2020-09-02 MED ORDER — EMPAGLIFLOZIN 25 MG PO TABS: 25.0000 mg | ORAL_TABLET | Freq: Every day | ORAL | 3 refills | Status: DC

## 2020-09-02 MED ORDER — ATORVASTATIN CALCIUM 40 MG PO TABS: 40.0000 mg | ORAL_TABLET | Freq: Every day | ORAL | 3 refills | Status: DC

## 2020-09-02 MED ORDER — METFORMIN HCL 1000 MG PO TABS: 1000.0000 mg | ORAL_TABLET | Freq: Every day | ORAL | 3 refills | Status: DC

## 2020-09-02 NOTE — Patient Instructions (Addendum)
If issues with Jardiance coverage, please call us! We can send alternative if needed, but insurance should cover this.   Med refills sent

## 2020-09-02 NOTE — Progress Notes (Signed)
HPI: Paul Reed is a 64 y.o. male who  has a past medical history of Bursitis, Chronic kidney disease, ELEVATED PROSTATE SPECIFIC ANTIGEN (10/23/2008), Hypothyroidism, Impaired fasting glucose, Obesity, and OSA on CPAP.  he presents to [facility] today, 09/02/20,  for chief complaint of: Recheck A1c  Patient reports he is generally doing well since his last visit. He has been doing okay with diet and exercise, but he has not had access to his jardiance, because the pharmacy told him his insurance had to authorize it. Per records review, a PA was sent for this medication in June, but [employer] replied that a PA was not necessary. It is unclear if this was communicated to the patient as he called a month later to check and that encounter was also unresolved. He currently denies headache, vision change, chest pain, SOB, abdominal pain, nausea, vomiting, urinary changes, paresthesias, or other symptoms. He would like a flu shot today.   Past medical, surgical, social and family history reviewed:  Patient Active Problem List   Diagnosis Date Noted  . Vitamin D deficiency 11/16/2017  . Hyperlipidemia associated with type 2 diabetes mellitus ([place]) 11/16/2017  . Elevated liver enzymes 11/16/2017  . Annual physical exam 09/11/2016  . Solitary pulmonary nodule 01/06/2016  . Type 2 diabetes mellitus ([place]) 10/29/2015  . Hyperglycemia 10/28/2015  . Hypertriglyceridemia 02/08/2013  . CALF PAIN, LEFT 10/30/2010  . Elevated PSA 10/23/2008  . Hypothyroidism 06/01/2007  . OBESITY NOS 06/01/2007  . NEPHROLITHIASIS 06/01/2007    Past Surgical History:  Procedure Laterality Date  . ESOPHAGUS SURGERY     Removed glass in infancy    Social History   Tobacco Use  . Smoking status: Never Smoker  . Smokeless tobacco: Never Used  Substance Use Topics  . Alcohol use: Yes    Comment: very infrequent    Family History  Problem Relation Age of Onset  . Cancer  Father   . Thyroid disease Unknown        mother     Current medication list and allergy/intolerance information reviewed:    Current Outpatient Medications  Medication Sig Dispense Refill  . atorvastatin (LIPITOR) 40 MG tablet Take 1 tablet (40 mg total) by mouth daily. 90 tablet 3  . blood glucose meter kit and supplies KIT Dispense based on patient and insurance preference. Use up to four times daily as directed. Please include lancets, test strips, control solution. 1 each prn  . empagliflozin (JARDIANCE) 25 MG TABS tablet Take 1 tablet (25 mg total) by mouth daily. 90 tablet 3  . levothyroxine (SYNTHROID) 112 MCG tablet Take 1 tablet (112 mcg total) by mouth daily before breakfast. 90 tablet 3  . metFORMIN (GLUCOPHAGE) 1000 MG tablet Take 1 tablet (1,000 mg total) by mouth daily. 90 tablet 3   No current facility-administered medications for this visit.    No Known Allergies    Review of Systems:  Constitutional:  No  fever, no chills  HEENT: No  headache, no vision change  Cardiac: No  chest pain  Respiratory:  No  shortness of breath. No  Cough  Gastrointestinal: No  abdominal pain, No  nausea, No  vomiting  Musculoskeletal: No new myalgia/arthralgia  Skin: No  Rash, No other wounds/concerning lesions  Endocrine:No polyuria/polydipsia/polyphagia   Neurologic: No  weakness, No  dizziness  Psychiatric: No  concerns with depression  Exam:  BP 106/70 (BP Location: Left Arm, Patient Position: Sitting)   Pulse 65  Wt 211 lb (95.7 kg)   SpO2 97%   BMI 33.05 kg/m   Constitutional: VS see above. General Appearance: alert, well-developed, well-nourished, NAD  Eyes: Normal lids and conjunctive, non-icteric sclera   Neck: No masses, trachea midline.   Respiratory: Normal respiratory effort. no wheeze, no rhonchi, no rales  Cardiovascular: S1/S2 normal, no murmur, no rub/gallop auscultated. RRR. No lower extremity edema. Pedal pulse II/IV bilaterally DP and  PT.  Gastrointestinal: Nontender, no masses.   Musculoskeletal: Gait normal. No clubbing/cyanosis of digits.   Diabetic foot exam: No ulcers/wounds. Normal monofilament exam. Normal DP/PT pulses bilaterally.  Neurological: Normal balance/coordination. No tremor. No cranial nerve deficit on limited exam.  Skin: warm, dry, intact. No rash/ulcer. No concerning nevi or subq nodules on limited exam.   Psychiatric: Normal judgment/insight. Normal mood and affect. Oriented x3.    Results for orders placed or performed in visit on 09/02/20 (from the past 72 hour(s))  POCT HgB A1C     Status: Abnormal   Collection Time: 09/02/20  2:06 PM  Result Value Ref Range   Hemoglobin A1C 7.8 (A) 4.0 - 5.6 %   HbA1c POC (<> result, manual entry) 7.8 4.0 - 5.6 %   HbA1c, POC (prediabetic range) 7.8 (A) 5.7 - 6.4 %   HbA1c, POC (controlled diabetic range) 7.8 (A) 0.0 - 7.0 %    No results found.   ASSESSMENT/PLAN: The primary encounter diagnosis was Type 2 diabetes mellitus without complication, without long-term current use of insulin ([place]). Diagnoses of Hypothyroidism, Hyperlipidemia associated with type 2 diabetes mellitus ([place]), and Flu vaccine need were also pertinent to this visit.   Type 2 Diabetes Mellitus  A1c 7.8 today (up from 7.2 in May).  Resent prescription for jardiance. Insurance has said no PA required. If patient has problems acquiring medication, he will let us know.  Recheck A1c in 4-6 months.  Routine Health Maintenance  Flu shot given today.  Follow up in 4-6 months.     Orders Placed This Encounter  Procedures  . Flu Vaccine QUAD 6+ mos PF IM (Fluarix Quad PF)  . POCT HgB A1C    Meds ordered this encounter  Medications  . metFORMIN (GLUCOPHAGE) 1000 MG tablet    Sig: Take 1 tablet (1,000 mg total) by mouth daily.    Dispense:  90 tablet    Refill:  3  . empagliflozin (JARDIANCE) 25 MG TABS tablet    Sig: Take 1 tablet (25 mg total) by mouth daily.     Dispense:  90 tablet    Refill:  3    IF FARXIGA PREFERRED, OK TO SUBSTITUTE FARXIGA 10 MG TABLETS TAKE 1 PO DAILY #90 REFILL x3  . levothyroxine (SYNTHROID) 112 MCG tablet    Sig: Take 1 tablet (112 mcg total) by mouth daily before breakfast.    Dispense:  90 tablet    Refill:  3  . atorvastatin (LIPITOR) 40 MG tablet    Sig: Take 1 tablet (40 mg total) by mouth daily.    Dispense:  90 tablet    Refill:  3    Patient Instructions  If issues with Jardiance coverage, please call us! We can send alternative if needed, but insurance should cover this.   Med refills sent           Visit summary with medication list and pertinent instructions was printed for patient to review. All questions at time of visit were answered - patient instructed to contact office with any  additional concerns or updates. ER/RTC precautions were reviewed with the patient.    Please note: voice recognition software was used to produce this document, and typos may escape review. Please contact Dr. Sheppard Coil for any needed clarifications.     Follow-up plan: Return in about 4 months (around 01/02/2021) for recheck A1C, see [place] sooner if needed.  Gweneth Dimitri, [facility] [id number]

## 2021-01-02 ENCOUNTER — Ambulatory Visit: Payer: BC Managed Care – PPO | Admitting: Osteopathic Medicine

## 2021-06-16 ENCOUNTER — Other Ambulatory Visit: Payer: Self-pay

## 2021-06-16 ENCOUNTER — Telehealth: Payer: Self-pay | Admitting: Osteopathic Medicine

## 2021-06-16 DIAGNOSIS — E039 Hypothyroidism, unspecified: Secondary | ICD-10-CM

## 2021-06-16 MED ORDER — LEVOTHYROXINE SODIUM 112 MCG PO TABS: 112.0000 ug | ORAL_TABLET | Freq: Every day | ORAL | 0 refills | Status: DC

## 2021-06-16 NOTE — Telephone Encounter (Signed)
Patient came into office this morning requesting that we change primary pharmacy to [facility] on [address] in [place]. He also stated that he was needing a refill on Levothyroxine.

## 2021-06-16 NOTE — Telephone Encounter (Signed)
Task completed. At patient's request, pharmacy info has been updated. Refills for levothyroxine sent to preferred pharmacy. Please update the patient. Thanks in advance.

## 2021-07-16 DIAGNOSIS — E119 Type 2 diabetes mellitus without complications: Secondary | ICD-10-CM | POA: Diagnosis not present

## 2021-07-16 DIAGNOSIS — I951 Orthostatic hypotension: Secondary | ICD-10-CM | POA: Diagnosis not present

## 2021-07-16 DIAGNOSIS — Z7984 Long term (current) use of oral hypoglycemic drugs: Secondary | ICD-10-CM | POA: Diagnosis not present

## 2021-07-16 DIAGNOSIS — G4733 Obstructive sleep apnea (adult) (pediatric): Secondary | ICD-10-CM | POA: Diagnosis not present

## 2021-07-16 DIAGNOSIS — E039 Hypothyroidism, unspecified: Secondary | ICD-10-CM | POA: Diagnosis not present

## 2021-07-16 DIAGNOSIS — R030 Elevated blood-pressure reading, without diagnosis of hypertension: Secondary | ICD-10-CM | POA: Diagnosis not present

## 2021-07-16 DIAGNOSIS — E785 Hyperlipidemia, unspecified: Secondary | ICD-10-CM | POA: Diagnosis not present

## 2021-07-16 DIAGNOSIS — Z7722 Contact with and (suspected) exposure to environmental tobacco smoke (acute) (chronic): Secondary | ICD-10-CM | POA: Diagnosis not present

## 2021-07-16 DIAGNOSIS — Z6831 Body mass index (BMI) 31.0-31.9, adult: Secondary | ICD-10-CM | POA: Diagnosis not present

## 2021-07-16 DIAGNOSIS — E669 Obesity, unspecified: Secondary | ICD-10-CM | POA: Diagnosis not present

## 2021-07-30 DIAGNOSIS — Z0130 Encounter for examination of blood pressure without abnormal findings: Secondary | ICD-10-CM | POA: Diagnosis not present

## 2021-07-30 DIAGNOSIS — M25512 Pain in left shoulder: Secondary | ICD-10-CM | POA: Diagnosis not present

## 2021-07-30 DIAGNOSIS — I10 Essential (primary) hypertension: Secondary | ICD-10-CM | POA: Diagnosis not present

## 2021-08-04 ENCOUNTER — Other Ambulatory Visit: Payer: Self-pay

## 2021-08-04 ENCOUNTER — Ambulatory Visit (INDEPENDENT_AMBULATORY_CARE_PROVIDER_SITE_OTHER): Payer: 59 | Admitting: Osteopathic Medicine

## 2021-08-04 ENCOUNTER — Encounter: Payer: Self-pay | Admitting: Osteopathic Medicine

## 2021-08-04 ENCOUNTER — Ambulatory Visit (INDEPENDENT_AMBULATORY_CARE_PROVIDER_SITE_OTHER): Payer: 59

## 2021-08-04 VITALS — BP 120/70 | HR 75 | Temp 98.6°F | Wt 211.0 lb

## 2021-08-04 DIAGNOSIS — G8929 Other chronic pain: Secondary | ICD-10-CM

## 2021-08-04 DIAGNOSIS — M25512 Pain in left shoulder: Secondary | ICD-10-CM

## 2021-08-04 DIAGNOSIS — E785 Hyperlipidemia, unspecified: Secondary | ICD-10-CM | POA: Diagnosis not present

## 2021-08-04 DIAGNOSIS — R748 Abnormal levels of other serum enzymes: Secondary | ICD-10-CM

## 2021-08-04 DIAGNOSIS — E559 Vitamin D deficiency, unspecified: Secondary | ICD-10-CM | POA: Diagnosis not present

## 2021-08-04 DIAGNOSIS — E1169 Type 2 diabetes mellitus with other specified complication: Secondary | ICD-10-CM

## 2021-08-04 DIAGNOSIS — E119 Type 2 diabetes mellitus without complications: Secondary | ICD-10-CM

## 2021-08-04 DIAGNOSIS — E039 Hypothyroidism, unspecified: Secondary | ICD-10-CM

## 2021-08-04 DIAGNOSIS — M19012 Primary osteoarthritis, left shoulder: Secondary | ICD-10-CM | POA: Diagnosis not present

## 2021-08-04 DIAGNOSIS — S4992XA Unspecified injury of left shoulder and upper arm, initial encounter: Secondary | ICD-10-CM | POA: Diagnosis not present

## 2021-08-04 MED ORDER — NAPROXEN 500 MG PO TABS: 500.0000 mg | ORAL_TABLET | Freq: Two times a day (BID) | ORAL | 1 refills | Status: DC

## 2021-08-04 MED ORDER — GLUCOSE BLOOD VI STRP: ORAL_STRIP | 99 refills | Status: DC

## 2021-08-04 MED ORDER — PREDNISONE 20 MG PO TABS: 20.0000 mg | ORAL_TABLET | Freq: Two times a day (BID) | ORAL | 0 refills | Status: DC

## 2021-08-04 NOTE — Progress Notes (Signed)
Paul Reed is a 65 y.o. male who presents to  [place] [facility]  today, 08/04/21, seeking care for the following:  Shoulder pain - was sore day following doing yard work 2 mos ago, reports pain in almost any plan of motion, has been resting it, went to UC and was Rx muscle relaxer but hasn't taken this yet. On exam, unable to fully assess d/t limited ROM, he cannot abduct shoulder much at all, he reports pain w/ ant/post apprehension tests, ok with cross arm test, even on passive ROM he reports pain  ASSESSMENT & PLAN with other pertinent findings:  The primary encounter diagnosis was Chronic left shoulder pain - no traumatic injury, suspect rotator cuff strain vs adhesive capsulitis vs osteoarthritis . Diagnoses of Type 2 diabetes mellitus without complication, without long-term current use of insulin ([place]), Hypothyroidism, unspecified type, Hyperlipidemia associated with type 2 diabetes mellitus ([place]), Vitamin D deficiency, and Elevated liver enzymes were also pertinent to this visit.    Patient Instructions  1. Left shoulder pain - no traumatic injury, suspect rotator cuff strain vs adhesive capsulitis vs osteoarthritis  Xray Rm 110 downstairs anytime  Prescriptions: Steroids (predisone) burst for 5 days Anti-inflammatory (baproxen) take twice daily for 5-7 days then can keep on had for use as-needed after that Refer for formal physical therapy Please contact our office for sports medicine appointment with Dr T if PT not helping    2. Type 2 diabetes mellitus without complication, without long-term current use of insulin (HCC) Refilled test strips  Overdue for A1C and other labs! Please get blood work done sometime prior to follow-up visit in the next month or so, we can get you annual physical done   3. Hypothyroidism, unspecified type 4. Hyperlipidemia associated with type 2 diabetes mellitus ([place]) 5. Vitamin D deficiency 6.  Elevated liver enzymes Need labs! See above   Orders Placed This Encounter  Procedures   DG Shoulder Left   CBC   COMPLETE METABOLIC PANEL WITH GFR   Lipid panel   Hemoglobin A1c   TSH   PSA, Total with Reflex to PSA, Free   Ambulatory referral to Physical Therapy    Meds ordered this encounter  Medications   naproxen (NAPROSYN) 500 MG tablet    Sig: Take 1 tablet (500 mg total) by mouth 2 (two) times daily with a meal. Take every day for one week, then use as needed after that    Dispense:  60 tablet    Refill:  1   predniSONE (DELTASONE) 20 MG tablet    Sig: Take 1 tablet (20 mg total) by mouth 2 (two) times daily with a meal.    Dispense:  10 tablet    Refill:  0   glucose blood test strip    Sig: Use up to 4 times per day as directed with glucometer. Disp: 100. Refill x99    Dispense:  100 strip    Refill:  99     See below for relevant physical exam findings  See below for recent lab and imaging results reviewed  Medications, allergies, PMH, PSH, SocH, FamH reviewed below    Follow-up instructions: Return in about 4 weeks (around 09/01/2021) for ANNUAL (labs 2+ days prior to appt, orders are in).  Exam:  BP 120/70 (BP Location: Left Arm, Patient Position: Sitting, Cuff Size: Normal)   Pulse 75   Temp 98.6 F (37 C) (Oral)   Wt 211 lb 0.6 oz (95.7 kg)   BMI 33.05 kg/m  Constitutional: VS see above. General Appearance: alert, well-developed, well-nourished, NAD Neck: No masses, trachea midline.  Respiratory: Normal respiratory effort.  Musculoskeletal: Gait normal. See above re: shoulder  Neurological: Normal balance/coordination. No tremor. Skin: warm, dry, intact.  Psychiatric: Normal judgment/insight. Normal mood and affect. Oriented x3.   Current Meds  Medication Sig   atorvastatin (LIPITOR) 40 MG tablet Take 1 tablet (40 mg total) by mouth daily.   blood glucose meter kit and  supplies KIT Dispense based on patient and insurance preference. Use up to four times daily as directed. Please include lancets, test strips, control solution.   empagliflozin (JARDIANCE) 25 MG TABS tablet Take 1 tablet (25 mg total) by mouth daily.   glucose blood test strip Use up to 4 times per day as directed with glucometer. Disp: 100. Refill x99   levothyroxine (SYNTHROID) 112 MCG tablet Take 1 tablet (112 mcg total) by mouth daily before breakfast.   metFORMIN (GLUCOPHAGE) 1000 MG tablet Take 1 tablet (1,000 mg total) by mouth daily.   naproxen (NAPROSYN) 500 MG tablet Take 1 tablet (500 mg total) by mouth 2 (two) times daily with a meal. Take every day for one week, then use as needed after that   orphenadrine (NORFLEX) 100 MG tablet Take 100 mg by mouth 2 (two) times daily as needed.   predniSONE (DELTASONE) 20 MG tablet Take 1 tablet (20 mg total) by mouth 2 (two) times daily with a meal.    No Known Allergies  Patient Active Problem List   Diagnosis Date Noted   Vitamin D deficiency 11/16/2017   Hyperlipidemia associated with type 2 diabetes mellitus ([place]) 11/16/2017   Elevated liver enzymes 11/16/2017   Annual physical exam 09/11/2016   Solitary pulmonary nodule 01/06/2016   Type 2 diabetes mellitus ([place]) 10/29/2015   Hyperglycemia 10/28/2015   Hypertriglyceridemia 02/08/2013   CALF PAIN, LEFT 10/30/2010   Elevated PSA 10/23/2008   Hypothyroidism 06/01/2007   OBESITY NOS 06/01/2007   NEPHROLITHIASIS 06/01/2007    Family History  Problem Relation Age of Onset   Cancer Father    Thyroid disease Unknown        mother    Social History   Tobacco Use  Smoking Status Never  Smokeless Tobacco Never    Past Surgical History:  Procedure Laterality Date   ESOPHAGUS SURGERY     Removed glass in infancy    Immunization History  Administered Date(s) Administered   Influenza Split 10/12/2011, 10/10/2012   Influenza Whole 10/12/2010   Influenza, Quadrivalent,  Recombinant, Inj, Pf 10/19/2019   Influenza, Seasonal, Injecte, Preservative Fre 08/09/2013   Influenza,inj,Quad PF,6+ Mos 09/06/2014, 10/28/2015, 09/10/2016, 09/15/2017, 11/21/2018, 09/02/2020   Moderna Sars-Covid-2 Vaccination 03/21/2020, 04/18/2020   Pneumococcal Polysaccharide-23 11/21/2018   Td 06/04/2008   Zoster Recombinat (Shingrix) 11/30/2018    No results found for this or any previous visit (from the past 2160 hour(s)).  No results found.     All questions at time of visit were answered - patient instructed to contact office with any additional concerns or updates. ER/RTC precautions were reviewed with the patient as applicable.   Please note: manual typing as well as voice recognition software may have been used to produce this document - typos may escape review. Please contact  Dr. Sheppard Coil for any needed clarifications.

## 2021-08-04 NOTE — Patient Instructions (Signed)
1. Left shoulder pain - no traumatic injury, suspect rotator cuff strain vs adhesive capsulitis vs osteoarthritis  Xray Rm 110 downstairs anytime  Prescriptions: Steroids (predisone) burst for 5 days Anti-inflammatory (baproxen) take twice daily for 5-7 days then can keep on had for use as-needed after that Refer for formal physical therapy Please contact our office for sports medicine appointment with Dr T if PT not helping    2. Type 2 diabetes mellitus without complication, without long-term current use of insulin (HCC) Refilled test strips  Overdue for A1C and other labs! Please get blood work done sometime prior to follow-up visit in the next month or so, we can get you annual physical done   3. Hypothyroidism, unspecified type 4. Hyperlipidemia associated with type 2 diabetes mellitus (HCC) 5. Vitamin D deficiency 6. Elevated liver enzymes Need labs! See above

## 2021-08-10 ENCOUNTER — Encounter: Payer: Self-pay | Admitting: Osteopathic Medicine

## 2021-08-13 ENCOUNTER — Encounter: Payer: Self-pay | Admitting: Rehabilitative and Restorative Service Providers"

## 2021-08-13 ENCOUNTER — Other Ambulatory Visit: Payer: Self-pay

## 2021-08-13 ENCOUNTER — Ambulatory Visit (INDEPENDENT_AMBULATORY_CARE_PROVIDER_SITE_OTHER): Payer: 59 | Admitting: Rehabilitative and Restorative Service Providers"

## 2021-08-13 DIAGNOSIS — M6281 Muscle weakness (generalized): Secondary | ICD-10-CM | POA: Diagnosis not present

## 2021-08-13 DIAGNOSIS — R293 Abnormal posture: Secondary | ICD-10-CM | POA: Diagnosis not present

## 2021-08-13 DIAGNOSIS — M25512 Pain in left shoulder: Secondary | ICD-10-CM | POA: Diagnosis not present

## 2021-08-13 DIAGNOSIS — G8929 Other chronic pain: Secondary | ICD-10-CM

## 2021-08-13 NOTE — Therapy (Signed)
[facility] [place] [address] [place], [place], [address] Phone: [phone]   Fax:  [phone]  Physical Therapy Treatment  Patient Details  Name: Paul Reed MRN: [id number] Date of Birth: [birth year] Referring Provider (PT): Sunnie Nielsen   Encounter Date: 08/13/2021   PT End of Session - 08/13/21 1011     Visit Number 1    Number of Visits 12    Date for PT Re-Evaluation 09/24/21    PT Start Time 0934    PT Stop Time 1014    PT Time Calculation (min) 40 min    Activity Tolerance Patient tolerated treatment well    Behavior During Therapy [facility] for tasks assessed/performed             Past Medical History:  Diagnosis Date   Bursitis    Chronic kidney disease    stones   ELEVATED PROSTATE SPECIFIC ANTIGEN 10/23/2008   Biopsy planned with Dr. Sherryl Barters ([facility])     Hypothyroidism    Impaired fasting glucose    Obesity    OSA on CPAP    13 years    Past Surgical History:  Procedure Laterality Date   ESOPHAGUS SURGERY     Removed glass in infancy    There were no vitals filed for this visit.   Subjective Assessment - 08/13/21 0936     Subjective The patient began with pain after yard work in May 2022.  He woke with soreness around that time in the left shoulder.  His pain is improved after the steroid doses.  He notes he cannot lift the left arm up to go through the drive through window.  He can still mow the yard and do tasks below 90 degrees.    Pertinent History diabetes    Patient Stated Goals Use it overhead like the other side.    Currently in Pain? Yes   None at rest   Pain Score 6     Pain Location Shoulder    Pain Orientation Left    Pain Descriptors / Indicators Aching;Tightness;Sore    Pain Type Chronic pain    Pain Onset More than a month ago    Pain Frequency Intermittent    Aggravating Factors  overhead lifting >90 degrees    Pain Relieving Factors rest/ arm at neutral                 [facility] PT Assessment - 08/13/21 0945       Assessment   Medical Diagnosis chronic left shoulder pain    Referring Provider (PT) Sunnie Nielsen    Onset Date/Surgical Date --   May 2022   Hand Dominance Right      Precautions   Precautions None      Restrictions   Weight Bearing Restrictions No      Balance Screen   Has the patient fallen in the past 6 months No    Has the patient had a decrease in activity level because of a fear of falling?  No    Is the patient reluctant to leave their home because of a fear of falling?  No      Home Environment   Living Environment Private residence    Living Arrangements Spouse/significant other      Prior Function   Level of Independence Independent      Observation/Other Assessments   Focus on Therapeutic Outcomes (FOTO)  55%      Sensation   Light  Touch Appears Intact      ROM / Strength   AROM / PROM / Strength AROM;Strength;PROM      AROM   Overall AROM  Deficits    AROM Assessment Site Shoulder    Right/Left Shoulder Right;Left    Right Shoulder Flexion 150 Degrees    Right Shoulder ABduction 140 Degrees    Right Shoulder Internal Rotation 60 Degrees    Right Shoulder External Rotation 42 Degrees    Left Shoulder Flexion 107 Degrees    Left Shoulder ABduction 78 Degrees    Left Shoulder Internal Rotation 60 Degrees    Left Shoulder External Rotation 30 Degrees      PROM   Overall PROM  Deficits    PROM Assessment Site Shoulder    Right/Left Shoulder Left    Left Shoulder Flexion 120 Degrees    Left Shoulder ABduction 82 Degrees    Left Shoulder External Rotation 40 Degrees      Strength   Overall Strength Deficits    Overall Strength Comments Did not test due to <3/5 strength with inability to lift through available ROM      Palpation   Palpation comment myofascial tightness noted in pec, upper trap, and cervical musculature upon palpation; also note periscapular myofascial tightness       Special Tests    Special Tests --    Rotator Cuff Impingment tests Painful Arc of Motion;Lift- off test      Lift-Off test   Findings Negative                           [facility] Adult PT Treatment/Exercise - 08/13/21 0001       Exercises   Exercises Shoulder      Shoulder Exercises: Supine   External Rotation AAROM;Left    Flexion AAROM;Left;12 reps      Shoulder Exercises: Standing   Flexion AAROM;Left;10 reps    Flexion Limitations in door frame    Retraction Both;12 reps      Shoulder Exercises: Stretch   Corner Stretch Limitations door frame stretch is painful;                    PT Education - 08/13/21 1011     Education Details HEP    Person(s) Educated Patient    Methods Explanation;Demonstration;Handout    Comprehension Verbalized understanding;Returned demonstration                 PT Long Term Goals - 08/13/21 1308       PT LONG TERM GOAL #1   Title The patient will be indep with HEP.    Time 6    Period Weeks    Target Date 09/24/21      PT LONG TERM GOAL #2   Title The patient will improve L shoulder AROM to > or equal to 140 degrees flexion and abduction.    Time 6    Period Weeks    Target Date 09/24/21      PT LONG TERM GOAL #3   Title The patient will improve L shoulder AROM into ER to 40 degrees.    Baseline 30 degrees    Time 6    Period Weeks    Target Date 09/24/21      PT LONG TERM GOAL #4   Title The patient will be able to lift L arm to reach for bag/drink at drive thru.  Baseline unable    Time 6    Period Weeks    Target Date 09/24/21      PT LONG TERM GOAL #5   Title The patient will improve functional status score from 55% to > or equal to 70%.    Time 6    Period Weeks    Target Date 09/24/21                   Plan - 08/13/21 1312     Clinical Impression Statement The patient is a 65 yo male presenting to OP physical therapy with 3 month h/o L shoulder pain.  He presents  with dec'd AROM, dec'd PROM, pain superior aspect of shoulder, dec'd strength, postural changes, and myofascial tightness.  PT to address deficits to return to prior functional status.    Personal Factors and Comorbidities Comorbidity 1    Comorbidities diabetes    Stability/Clinical Decision Making Stable/Uncomplicated    Clinical Decision Making Low    Rehab Potential Good    PT Frequency 2x / week    PT Duration 6 weeks    PT Treatment/Interventions ADLs/Self Care Home Management;Taping;Patient/family education;Therapeutic activities;Therapeutic exercise;Dry needling;Manual techniques;Aquatic Therapy;Electrical Stimulation;Moist Heat;Passive range of motion    PT Next Visit Plan check HEP, work on [employer] as tolerated.  Add strengthening for scapular stabilizers and posterior shoulder girdle to tolerance.    PT Home Exercise Plan [email or web address]    Consulted and Agree with Plan of Care Patient             Patient will benefit from skilled therapeutic intervention in order to improve the following deficits and impairments:  Pain, Decreased range of motion, Increased fascial restricitons, Decreased strength, Hypomobility, Impaired flexibility, Postural dysfunction  Visit Diagnosis: Chronic left shoulder pain  Muscle weakness (generalized)  Abnormal posture     Problem List Patient Active Problem List   Diagnosis Date Noted   Vitamin D deficiency 11/16/2017   Hyperlipidemia associated with type 2 diabetes mellitus (HCC) 11/16/2017   Elevated liver enzymes 11/16/2017   Annual physical exam 09/11/2016   Solitary pulmonary nodule 01/06/2016   Type 2 diabetes mellitus (HCC) 10/29/2015   Hyperglycemia 10/28/2015   Hypertriglyceridemia 02/08/2013   CALF PAIN, LEFT 10/30/2010   Elevated PSA 10/23/2008   Hypothyroidism 06/01/2007   OBESITY NOS 06/01/2007   NEPHROLITHIASIS 06/01/2007   Margretta Ditty PT, MPT  Trinisha Paget 08/13/2021, 1:17 PM  [facility] [address] [place], [place], [address] Phone: [phone]   Fax:  [phone]  Name: Paul Reed MRN: [id number] Date of Birth: March 01, 1956

## 2021-08-13 NOTE — Patient Instructions (Signed)
Access Code: [email or web address] URL: [email or web address] Date: 08/13/2021 Prepared by: Margretta Ditty  Exercises Supine Shoulder Flexion Extension AAROM with Dowel - 2 x daily - 7 x weekly - 1 sets - 10 reps Supine Shoulder External Rotation with Dowel - 2 x daily - 7 x weekly - 1 sets - 10 reps Standing Anatomical Position with Scapular Retraction and Depression at Wall - 2 x daily - 7 x weekly - 1 sets - 10 reps Standing Single Arm Shoulder Flexion Stretch on Wall - 2 x daily - 7 x weekly - 1 sets - 10 reps

## 2021-08-13 NOTE — Addendum Note (Signed)
Addended by: Margretta Ditty M on: 08/13/2021 01:20 PM   Modules accepted: Orders

## 2021-08-18 ENCOUNTER — Ambulatory Visit (INDEPENDENT_AMBULATORY_CARE_PROVIDER_SITE_OTHER): Payer: 59 | Admitting: Physical Therapy

## 2021-08-18 ENCOUNTER — Other Ambulatory Visit: Payer: Self-pay

## 2021-08-18 DIAGNOSIS — M25512 Pain in left shoulder: Secondary | ICD-10-CM

## 2021-08-18 DIAGNOSIS — M6281 Muscle weakness (generalized): Secondary | ICD-10-CM | POA: Diagnosis not present

## 2021-08-18 DIAGNOSIS — R293 Abnormal posture: Secondary | ICD-10-CM

## 2021-08-18 DIAGNOSIS — G8929 Other chronic pain: Secondary | ICD-10-CM

## 2021-08-18 NOTE — Patient Instructions (Signed)
Access Code: [email or web address] URL: [email or web address] Date: 08/18/2021 Prepared by: [facility]  Exercises Supine Shoulder Flexion Extension AAROM with Dowel - 2 x daily - 7 x weekly - 1 sets - 10 reps Supine Shoulder External Rotation with Dowel - 2 x daily - 7 x weekly - 1 sets - 10 reps Standing Anatomical Position with Scapular Retraction and Depression at Wall - 2 x daily - 7 x weekly - 1 sets - 10 reps Standing Single Arm Shoulder Flexion Stretch on Wall - 2 x daily - 7 x weekly - 1 sets - 10 reps Seated Shoulder Flexion Towel Slide at Table Top - 1 x daily - 7 x weekly - 1 sets - 5 reps - 5 seconds hold Standing Shoulder Extension with Dowel - 1 x daily - 7 x weekly - 1 sets - 10 reps

## 2021-08-18 NOTE — Therapy (Signed)
[facility] [place] [address] [place], [place], [address] Phone: [phone]   Fax:  [phone]  Physical Therapy Treatment  Patient Details  Name: Paul Reed MRN: [id number] Date of Birth: [birth year] Referring Provider (PT): Sunnie Nielsen   Encounter Date: 08/18/2021   PT End of Session - 08/18/21 0849     Visit Number 2    Number of Visits 12    Date for PT Re-Evaluation 09/24/21    PT Start Time 0849    PT Stop Time 0928    PT Time Calculation (min) 39 min    Activity Tolerance Patient tolerated treatment well    Behavior During Therapy [facility] for tasks assessed/performed             Past Medical History:  Diagnosis Date   Bursitis    Chronic kidney disease    stones   ELEVATED PROSTATE SPECIFIC ANTIGEN 10/23/2008   Biopsy planned with Dr. Sherryl Barters ([facility])     Hypothyroidism    Impaired fasting glucose    Obesity    OSA on CPAP    13 years    Past Surgical History:  Procedure Laterality Date   ESOPHAGUS SURGERY     Removed glass in infancy    There were no vitals filed for this visit.   Subjective Assessment - 08/18/21 0853     Subjective Pt reports he has been doing his exercises, a couple times a day. He is limited lifting his Lt shoulder above shoulder height.    Pertinent History diabetes    Currently in Pain? No/denies    Pain Score 0-No pain    Aggravating Factors  lifting arm over 90 deg in flexion/ abdct    Pain Relieving Factors keeping arm netural                [facility] PT Assessment - 08/18/21 0001       Assessment   Medical Diagnosis chronic left shoulder pain    Referring Provider (PT) Sunnie Nielsen    Onset Date/Surgical Date --   May 2022   Hand Dominance Right              [facility] Adult PT Treatment/Exercise - 08/18/21 0001       Shoulder Exercises: Supine   External Rotation AAROM;Left;10 reps   supine with cane   Flexion AAROM;Both;10 reps;5 reps    cane     Shoulder Exercises: Standing   Extension AAROM;Both;10 reps   cane behind back   Retraction Both;10 reps   3-5 sec hold, with depression, pushing back of arms into wall per HEP.     Shoulder Exercises: ROM/Strengthening   UBE (Upper Arm Bike) L1: 1 min forward, 1 min backward, standing, for warm up      Shoulder Exercises: Stretch   Wall Stretch - Flexion --   10 reps, 5 sec hold   Table Stretch - Flexion 3 reps;10 seconds    Star Gazer Stretch 3 reps;20 seconds   hands on top of head   Other Shoulder Stretches supine, arms abdct ~70 deg for pec stretch  30 sec x 3 reps      Manual Therapy   Manual Therapy Soft tissue mobilization;Passive ROM    Soft tissue mobilization STM to Lt pec, subscap, infraspinatus to decrease fascial restrictions and improve ROM.    Passive ROM Lt shoulder abdct and ER  PT Long Term Goals - 08/13/21 1308       PT LONG TERM GOAL #1   Title The patient will be indep with HEP.    Time 6    Period Weeks    Target Date 09/24/21      PT LONG TERM GOAL #2   Title The patient will improve L shoulder AROM to > or equal to 140 degrees flexion and abduction.    Time 6    Period Weeks    Target Date 09/24/21      PT LONG TERM GOAL #3   Title The patient will improve L shoulder AROM into ER to 40 degrees.    Baseline 30 degrees    Time 6    Period Weeks    Target Date 09/24/21      PT LONG TERM GOAL #4   Title The patient will be able to lift L arm to reach for bag/drink at drive thru.    Baseline unable    Time 6    Period Weeks    Target Date 09/24/21      PT LONG TERM GOAL #5   Title The patient will improve functional status score from 55% to > or equal to 70%.    Time 6    Period Weeks    Target Date 09/24/21                   Plan - 08/18/21 0853     Clinical Impression Statement Pt observed holding arms in flexed position with hands clasped at abdomen.  Encouraged pt to bring  arms to neutral when he notices himself in this posture to assist with reducing tightness along the pecs and biceps brachii muscles.  He reported some discomfort during AAROM at his available end range.  Goals are ongoing.    Personal Factors and Comorbidities Comorbidity 1    Comorbidities diabetes    Stability/Clinical Decision Making Stable/Uncomplicated    Rehab Potential Good    PT Frequency 2x / week    PT Duration 6 weeks    PT Treatment/Interventions ADLs/Self Care Home Management;Taping;Patient/family education;Therapeutic activities;Therapeutic exercise;Dry needling;Manual techniques;Aquatic Therapy;Electrical Stimulation;Moist Heat;Passive range of motion    PT Next Visit Plan work on [employer] as tolerated.  Add strengthening for scapular stabilizers and posterior shoulder girdle to tolerance.  manual therapy vs DN for Lt shoulder.    PT Home Exercise Plan [email or web address]    Consulted and Agree with Plan of Care Patient             Patient will benefit from skilled therapeutic intervention in order to improve the following deficits and impairments:  Pain, Decreased range of motion, Increased fascial restricitons, Decreased strength, Hypomobility, Impaired flexibility, Postural dysfunction  Visit Diagnosis: Chronic left shoulder pain  Muscle weakness (generalized)  Abnormal posture     Problem List Patient Active Problem List   Diagnosis Date Noted   Vitamin D deficiency 11/16/2017   Hyperlipidemia associated with type 2 diabetes mellitus (HCC) 11/16/2017   Elevated liver enzymes 11/16/2017   Annual physical exam 09/11/2016   Solitary pulmonary nodule 01/06/2016   Type 2 diabetes mellitus (HCC) 10/29/2015   Hyperglycemia 10/28/2015   Hypertriglyceridemia 02/08/2013   CALF PAIN, LEFT 10/30/2010   Elevated PSA 10/23/2008   Hypothyroidism 06/01/2007   OBESITY NOS 06/01/2007   NEPHROLITHIASIS 06/01/2007    Mayer Camel, PTA 08/18/21 1:01 PM   [facility] [address] [place] [address]  [place], [place], [address] Phone: [phone]   Fax:  [phone]  Name: Paul Reed MRN: [id number] Date of Birth: 30-Apr-1956

## 2021-08-21 ENCOUNTER — Ambulatory Visit (INDEPENDENT_AMBULATORY_CARE_PROVIDER_SITE_OTHER): Payer: 59 | Admitting: Physical Therapy

## 2021-08-21 ENCOUNTER — Encounter: Payer: Self-pay | Admitting: Physical Therapy

## 2021-08-21 ENCOUNTER — Other Ambulatory Visit: Payer: Self-pay

## 2021-08-21 DIAGNOSIS — R293 Abnormal posture: Secondary | ICD-10-CM | POA: Diagnosis not present

## 2021-08-21 DIAGNOSIS — G8929 Other chronic pain: Secondary | ICD-10-CM | POA: Diagnosis not present

## 2021-08-21 DIAGNOSIS — M25512 Pain in left shoulder: Secondary | ICD-10-CM | POA: Diagnosis not present

## 2021-08-21 DIAGNOSIS — M6281 Muscle weakness (generalized): Secondary | ICD-10-CM | POA: Diagnosis not present

## 2021-08-21 NOTE — Therapy (Signed)
[facility] [place] [address] [place], [place], [address] Phone: [phone]   Fax:  [phone]  Physical Therapy Treatment  Patient Details  Name: Paul Reed MRN: [id number] Date of Birth: [birth year] Referring Provider (PT): Sunnie Nielsen   Encounter Date: 08/21/2021   PT End of Session - 08/21/21 0926     Visit Number 3    Number of Visits 12    Date for PT Re-Evaluation 09/24/21    PT Start Time 0847    PT Stop Time 0925    PT Time Calculation (min) 38 min    Activity Tolerance Patient tolerated treatment well    Behavior During Therapy [facility] for tasks assessed/performed             Past Medical History:  Diagnosis Date   Bursitis    Chronic kidney disease    stones   ELEVATED PROSTATE SPECIFIC ANTIGEN 10/23/2008   Biopsy planned with Dr. Sherryl Barters ([facility])     Hypothyroidism    Impaired fasting glucose    Obesity    OSA on CPAP    13 years    Past Surgical History:  Procedure Laterality Date   ESOPHAGUS SURGERY     Removed glass in infancy    There were no vitals filed for this visit.   Subjective Assessment - 08/21/21 0851     Subjective Pt reports he was sore after last visit.  He can notice a little difference (improvement) in range.    Pertinent History diabetes    Patient Stated Goals Use it overhead like the other side.    Currently in Pain? No/denies    Pain Score 0-No pain                [facility] PT Assessment - 08/21/21 0001       Assessment   Medical Diagnosis chronic left shoulder pain    Referring Provider (PT) Sunnie Nielsen    Onset Date/Surgical Date --   May 2022   Hand Dominance Right      AROM   Left Shoulder Extension 47 Degrees    Left Shoulder Flexion 128 Degrees    Left Shoulder ABduction 81 Degrees    Left Shoulder External Rotation --              [facility] Adult PT Treatment/Exercise - 08/21/21 0001       Shoulder Exercises: Sidelying    Other Sidelying Exercises Lt thoracic rotation with open book x 8 reps      Shoulder Exercises: Standing   Internal Rotation AAROM;Both;10 reps   cane behind back   ABduction AAROM;Left;10 reps   dowel, scaption   Extension AAROM;Both;10 reps   cane behind back   Row Both;12 reps    Theraband Level (Shoulder Row) Level 2 (Red)      Shoulder Exercises: Stretch   Table Stretch - Flexion --   8 reps, 5-10 sec   Table Stretch - Abduction --   8 reps, 3-5 sec hold   Star Gazer Stretch 20 seconds;4 reps   hands on top of head   Other Shoulder Stretches Lt bicep stretch holding counter x 20 sec      Manual Therapy   Soft tissue mobilization STM to Lt pec to decrease fascial restrictions and improve ROM.    Passive ROM Lt shoulder abdct and ER               PT Long Term Goals - 08/13/21  1308       PT LONG TERM GOAL #1   Title The patient will be indep with HEP.    Time 6    Period Weeks    Target Date 09/24/21      PT LONG TERM GOAL #2   Title The patient will improve L shoulder AROM to > or equal to 140 degrees flexion and abduction.    Time 6    Period Weeks    Target Date 09/24/21      PT LONG TERM GOAL #3   Title The patient will improve L shoulder AROM into ER to 40 degrees.    Baseline 30 degrees    Time 6    Period Weeks    Target Date 09/24/21      PT LONG TERM GOAL #4   Title The patient will be able to lift L arm to reach for bag/drink at drive thru.    Baseline unable    Time 6    Period Weeks    Target Date 09/24/21      PT LONG TERM GOAL #5   Title The patient will improve functional status score from 55% to > or equal to 70%.    Time 6    Period Weeks    Target Date 09/24/21                   Plan - 08/21/21 0903     Clinical Impression Statement Good improvement in flexion of L Shoulder ROM.  He reported some soreness with end range stretching, but otherwise is tolerating AAROM exercises well. Progressing well towards LTGs.    Personal  Factors and Comorbidities Comorbidity 1    Comorbidities diabetes    Stability/Clinical Decision Making Stable/Uncomplicated    Rehab Potential Good    PT Frequency 2x / week    PT Duration 6 weeks    PT Treatment/Interventions ADLs/Self Care Home Management;Taping;Patient/family education;Therapeutic activities;Therapeutic exercise;Dry needling;Manual techniques;Aquatic Therapy;Electrical Stimulation;Moist Heat;Passive range of motion    PT Next Visit Plan work on [employer] as tolerated.  Add strengthening for scapular stabilizers and posterior shoulder girdle to tolerance.  manual therapy vs DN for Lt shoulder.    PT Home Exercise Plan [id number]    Consulted and Agree with Plan of Care Patient             Patient will benefit from skilled therapeutic intervention in order to improve the following deficits and impairments:  Pain, Decreased range of motion, Increased fascial restricitons, Decreased strength, Hypomobility, Impaired flexibility, Postural dysfunction  Visit Diagnosis: Chronic left shoulder pain  Muscle weakness (generalized)  Abnormal posture     Problem List Patient Active Problem List   Diagnosis Date Noted   Vitamin D deficiency 11/16/2017   Hyperlipidemia associated with type 2 diabetes mellitus (HCC) 11/16/2017   Elevated liver enzymes 11/16/2017   Annual physical exam 09/11/2016   Solitary pulmonary nodule 01/06/2016   Type 2 diabetes mellitus (HCC) 10/29/2015   Hyperglycemia 10/28/2015   Hypertriglyceridemia 02/08/2013   CALF PAIN, LEFT 10/30/2010   Elevated PSA 10/23/2008   Hypothyroidism 06/01/2007   OBESITY NOS 06/01/2007   NEPHROLITHIASIS 06/01/2007    Mayer Camel, PTA 08/21/21 9:29 AM   [facility] [place] [address] [place], [place], [address] Phone: [phone]   Fax:  [phone]  Name: Paul Reed MRN: [id number] Date of Birth: 1956-04-26

## 2021-08-25 ENCOUNTER — Ambulatory Visit (INDEPENDENT_AMBULATORY_CARE_PROVIDER_SITE_OTHER): Payer: 59 | Admitting: Physical Therapy

## 2021-08-25 ENCOUNTER — Other Ambulatory Visit: Payer: Self-pay

## 2021-08-25 ENCOUNTER — Encounter: Payer: Self-pay | Admitting: Physical Therapy

## 2021-08-25 DIAGNOSIS — M25512 Pain in left shoulder: Secondary | ICD-10-CM

## 2021-08-25 DIAGNOSIS — G8929 Other chronic pain: Secondary | ICD-10-CM

## 2021-08-25 DIAGNOSIS — M6281 Muscle weakness (generalized): Secondary | ICD-10-CM | POA: Diagnosis not present

## 2021-08-25 DIAGNOSIS — R293 Abnormal posture: Secondary | ICD-10-CM

## 2021-08-25 NOTE — Therapy (Addendum)
[place] [address] [place], [place], [address] Phone: [phone]   Fax:  [phone]  Physical Therapy Treatment and Discharge Summary  Patient Details  Name: Paul Reed MRN: [id number] Date of Birth: 21-Nov-1956 Referring Provider (PT): Emeterio Reeve   Encounter Date: 08/25/2021   PT End of Session - 08/25/21 0850     Visit Number 4    Number of Visits 12    Date for PT Re-Evaluation 09/24/21    PT Start Time 0846    PT Stop Time 0925    PT Time Calculation (min) 39 min    Activity Tolerance Patient tolerated treatment well    Behavior During Therapy [facility] for tasks assessed/performed             Past Medical History:  Diagnosis Date   Bursitis    Chronic kidney disease    stones   ELEVATED PROSTATE SPECIFIC ANTIGEN 10/23/2008   Biopsy planned with Dr. 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   Subjective Assessment - 08/25/21 0847     Subjective Pt reports he has made an adjustment to how he reaches out window.  If he does that, doesn't have pain. He had a limb fall in back yard and will have to work on that over the next few days.    Currently in Pain? No/denies    Pain Score 0-No pain                [facility] PT Assessment - 08/25/21 0001       AROM   Left Shoulder Extension 48 Degrees      PROM   Left Shoulder Flexion 130 Degrees   supine   Left Shoulder External Rotation 50 Degrees              [facility] Adult PT Treatment/Exercise - 08/25/21 0001       Shoulder Exercises: Supine   External Rotation AAROM;Left;5 reps    Flexion AAROM;Left;Both;5 reps      Shoulder Exercises: Sidelying   Other Sidelying Exercises Lt thoracic rotation with open book x 8  reps      Shoulder Exercises: Standing   Internal Rotation AAROM;Both;10 reps   cane behind back   ABduction AAROM;Left;10 reps   dowel, scaption   Extension AAROM;Both;10 reps   cane behind back   Row Both;10 reps   2 sets   Theraband Level (Shoulder Row) Level 2 (Red);Level 3 (Green)      Shoulder Exercises: ROM/Strengthening   UBE (Upper Arm Bike) L2: 1.5 min forward, 1 min backward, standing      Shoulder Exercises: Stretch   Corner Stretch 20 seconds;2 reps    Table Stretch - Flexion 5 reps;10 seconds   arms on railing, walking backwards.   Other Shoulder Stretches sleeper stretch, 15 sec x 3 reps LUE, IR/ER.    Other Shoulder Stretches Lt bicep stretch holding counter x 20 sec      Manual Therapy   Manual Therapy Soft tissue mobilization;Scapular mobilization;Passive ROM    Soft tissue mobilization STM to Lt pec to decrease fascial restrictions and improve ROM.    Scapular Mobilization Lt shoulder  Passive ROM Lt shoulder flexion, horz abdct, ER, scaption, ext.                         PT Long Term Goals - 08/13/21 1308       PT LONG TERM GOAL #1   Title The patient will be indep with HEP.    Time 6    Period Weeks    Target Date 09/24/21      PT LONG TERM GOAL #2   Title The patient will improve L shoulder AROM to > or equal to 140 degrees flexion and abduction.    Time 6    Period Weeks    Target Date 09/24/21      PT LONG TERM GOAL #3   Title The patient will improve L shoulder AROM into ER to 40 degrees.    Baseline 30 degrees    Time 6    Period Weeks    Target Date 09/24/21      PT LONG TERM GOAL #4   Title The patient will be able to lift L arm to reach for bag/drink at drive thru.    Baseline unable    Time 6    Period Weeks    Target Date 09/24/21      PT LONG TERM GOAL #5   Title The patient will improve functional status score from 55% to > or equal to 70%.    Time 6    Period Weeks    Target Date 09/24/21                    Plan - 08/25/21 0938     Clinical Impression Statement Pt tolerating AAROM exercises for Lt shoulder well.  Slightly guarded with PROM at available end range due to increased discomfort.  Progressing gradually towards LTGs of therapy.    Personal Factors and Comorbidities Comorbidity 1    Comorbidities diabetes    Stability/Clinical Decision Making Stable/Uncomplicated    Rehab Potential Good    PT Frequency 2x / week    PT Duration 6 weeks    PT Treatment/Interventions ADLs/Self Care Home Management;Taping;Patient/family education;Therapeutic activities;Therapeutic exercise;Dry needling;Manual techniques;Aquatic Therapy;Electrical Stimulation;Moist Heat;Passive range of motion    PT Next Visit Plan work on [employer] as tolerated.  Add strengthening for scapular stabilizers and posterior shoulder girdle to tolerance.  manual therapy (not interested in DN at this time)    PT Home Exercise Plan [email or web address]    Consulted and Agree with Plan of Care Patient             Patient will benefit from skilled therapeutic intervention in order to improve the following deficits and impairments:  Pain, Decreased range of motion, Increased fascial restricitons, Decreased strength, Hypomobility, Impaired flexibility, Postural dysfunction  Visit Diagnosis: Chronic left shoulder pain  Muscle weakness (generalized)  Abnormal posture  PHYSICAL THERAPY DISCHARGE SUMMARY  Visits from Start of Care: 4  Current functional level related to goals / functional outcomes: See note above for last known patient status.     Remaining deficits: Patient did not return to therapy   Education / Equipment: HEP, self mgmt of symptoms.   Patient agrees to discharge. Patient goals were not met. Patient is being discharged due to not returning since the last visit.  Patient called to cancel remaining visits.    Problem List Patient Active Problem List   Diagnosis Date Noted   Vitamin D  deficiency 11/16/2017  Hyperlipidemia associated with type 2 diabetes mellitus ([place]) 11/16/2017   Elevated liver enzymes 11/16/2017   Annual physical exam 09/11/2016   Solitary pulmonary nodule 01/06/2016   Type 2 diabetes mellitus ([place]) 10/29/2015   Hyperglycemia 10/28/2015   Hypertriglyceridemia 02/08/2013   CALF PAIN, LEFT 10/30/2010   Elevated PSA 10/23/2008   Hypothyroidism 06/01/2007   OBESITY NOS 06/01/2007   NEPHROLITHIASIS 06/01/2007   Kerin Perna, PTA 08/25/21 9:42 AM  Rudell Cobb, PT  [facility] [place], [place], [address] Phone: [phone]   Fax:  [phone]  Name: Paul Reed MRN: [id number] Date of Birth: [birth year]

## 2021-08-28 ENCOUNTER — Encounter: Payer: 59 | Admitting: Physical Therapy

## 2021-08-31 DIAGNOSIS — E039 Hypothyroidism, unspecified: Secondary | ICD-10-CM | POA: Diagnosis not present

## 2021-08-31 DIAGNOSIS — E1169 Type 2 diabetes mellitus with other specified complication: Secondary | ICD-10-CM | POA: Diagnosis not present

## 2021-08-31 DIAGNOSIS — M25519 Pain in unspecified shoulder: Secondary | ICD-10-CM | POA: Diagnosis not present

## 2021-08-31 DIAGNOSIS — E559 Vitamin D deficiency, unspecified: Secondary | ICD-10-CM | POA: Diagnosis not present

## 2021-08-31 DIAGNOSIS — E119 Type 2 diabetes mellitus without complications: Secondary | ICD-10-CM | POA: Diagnosis not present

## 2021-08-31 DIAGNOSIS — R748 Abnormal levels of other serum enzymes: Secondary | ICD-10-CM | POA: Diagnosis not present

## 2021-08-31 DIAGNOSIS — G8929 Other chronic pain: Secondary | ICD-10-CM | POA: Diagnosis not present

## 2021-08-31 DIAGNOSIS — E785 Hyperlipidemia, unspecified: Secondary | ICD-10-CM | POA: Diagnosis not present

## 2021-09-01 ENCOUNTER — Encounter: Payer: 59 | Admitting: Physical Therapy

## 2021-09-01 LAB — CBC
HCT: 46.1 % (ref 38.5–50.0)
Hemoglobin: 15 g/dL (ref 13.2–17.1)
MCH: 31.1 pg (ref 27.0–33.0)
MCHC: 32.5 g/dL (ref 32.0–36.0)
MCV: 95.4 fL (ref 80.0–100.0)
MPV: 10.2 fL (ref 7.5–12.5)
Platelets: 320 10*3/uL (ref 140–400)
RBC: 4.83 10*6/uL (ref 4.20–5.80)
RDW: 12 % (ref 11.0–15.0)
WBC: 7.3 10*3/uL (ref 3.8–10.8)

## 2021-09-01 LAB — HEMOGLOBIN A1C
Hgb A1c MFr Bld: 10.4 % of total Hgb — ABNORMAL HIGH (ref <5.7)
Mean Plasma Glucose: 252 mg/dL
eAG (mmol/L): 13.9 mmol/L

## 2021-09-01 LAB — COMPLETE METABOLIC PANEL WITH GFR
AG Ratio: 1.3 (calc) (ref 1.0–2.5)
ALT: 62 U/L — ABNORMAL HIGH (ref 9–46)
AST: 62 U/L — ABNORMAL HIGH (ref 10–35)
Albumin: 4.4 g/dL (ref 3.6–5.1)
Alkaline phosphatase (APISO): 82 U/L (ref 35–144)
BUN: 11 mg/dL (ref 7–25)
CO2: 25 mmol/L (ref 20–32)
Calcium: 9.6 mg/dL (ref 8.6–10.3)
Chloride: 100 mmol/L (ref 98–110)
Creat: 0.87 mg/dL (ref 0.70–1.35)
Globulin: 3.3 g/dL (calc) (ref 1.9–3.7)
Glucose, Bld: 235 mg/dL — ABNORMAL HIGH (ref 65–99)
Potassium: 4.3 mmol/L (ref 3.5–5.3)
Sodium: 137 mmol/L (ref 135–146)
Total Bilirubin: 0.7 mg/dL (ref 0.2–1.2)
Total Protein: 7.7 g/dL (ref 6.1–8.1)
eGFR: 97 mL/min/{1.73_m2} (ref >=60)

## 2021-09-01 LAB — LIPID PANEL
Cholesterol: 171 mg/dL (ref <200)
HDL: 50 mg/dL (ref >=40)
LDL Cholesterol (Calc): 82 mg/dL (calc)
Non-HDL Cholesterol (Calc): 121 mg/dL (calc) (ref <130)
Total CHOL/HDL Ratio: 3.4 (calc) (ref <5.0)
Triglycerides: 326 mg/dL — ABNORMAL HIGH (ref <150)

## 2021-09-01 LAB — REFLEX PSA, FREE
PSA, % Free: 30 % (calc) (ref >25)
PSA, Free: 1.7 ng/mL

## 2021-09-01 LAB — PSA, TOTAL WITH REFLEX TO PSA, FREE: PSA, Total: 5.7 ng/mL — ABNORMAL HIGH (ref <=4.0)

## 2021-09-01 LAB — TSH: TSH: 10.65 mIU/L — ABNORMAL HIGH (ref 0.40–4.50)

## 2021-09-04 ENCOUNTER — Other Ambulatory Visit: Payer: Self-pay

## 2021-09-04 ENCOUNTER — Encounter: Payer: 59 | Admitting: Physical Therapy

## 2021-09-04 ENCOUNTER — Ambulatory Visit (INDEPENDENT_AMBULATORY_CARE_PROVIDER_SITE_OTHER): Payer: 59 | Admitting: Osteopathic Medicine

## 2021-09-04 ENCOUNTER — Encounter: Payer: Self-pay | Admitting: Osteopathic Medicine

## 2021-09-04 VITALS — BP 122/80 | HR 85 | Temp 98.0°F | Wt 208.1 lb

## 2021-09-04 DIAGNOSIS — E785 Hyperlipidemia, unspecified: Secondary | ICD-10-CM

## 2021-09-04 DIAGNOSIS — E1169 Type 2 diabetes mellitus with other specified complication: Secondary | ICD-10-CM | POA: Diagnosis not present

## 2021-09-04 DIAGNOSIS — Z0000 Encounter for general adult medical examination without abnormal findings: Secondary | ICD-10-CM

## 2021-09-04 DIAGNOSIS — R748 Abnormal levels of other serum enzymes: Secondary | ICD-10-CM | POA: Diagnosis not present

## 2021-09-04 DIAGNOSIS — Z23 Encounter for immunization: Secondary | ICD-10-CM

## 2021-09-04 DIAGNOSIS — E781 Pure hyperglyceridemia: Secondary | ICD-10-CM | POA: Diagnosis not present

## 2021-09-04 DIAGNOSIS — E039 Hypothyroidism, unspecified: Secondary | ICD-10-CM

## 2021-09-04 DIAGNOSIS — R972 Elevated prostate specific antigen [PSA]: Secondary | ICD-10-CM

## 2021-09-04 DIAGNOSIS — E119 Type 2 diabetes mellitus without complications: Secondary | ICD-10-CM | POA: Diagnosis not present

## 2021-09-04 MED ORDER — BLOOD GLUCOSE MONITOR KIT: PACK | 99 refills | Status: AC

## 2021-09-04 MED ORDER — LEVOTHYROXINE SODIUM 125 MCG PO TABS: 125.0000 ug | ORAL_TABLET | Freq: Every day | ORAL | 0 refills | Status: DC

## 2021-09-04 MED ORDER — ATORVASTATIN CALCIUM 40 MG PO TABS: 40.0000 mg | ORAL_TABLET | Freq: Every day | ORAL | 3 refills | Status: DC

## 2021-09-04 MED ORDER — GLUCOSE BLOOD VI STRP: ORAL_STRIP | 99 refills | Status: AC

## 2021-09-04 MED ORDER — EMPAGLIFLOZIN 25 MG PO TABS: 25.0000 mg | ORAL_TABLET | Freq: Every day | ORAL | 3 refills | Status: DC

## 2021-09-04 MED ORDER — METFORMIN HCL 1000 MG PO TABS: 1000.0000 mg | ORAL_TABLET | Freq: Every day | ORAL | 3 refills | Status: DC

## 2021-09-04 NOTE — Patient Instructions (Addendum)
General Preventive Care Most recent routine screening labs: reviewed.  Hypothyroid: TSH 10.65 Diabetes: A1C 10.4 Cholesterol: TG 326 up from 97 a year ago, LDL 82 up from 31 a year ago Elevated liver enzymes: AST 62 and ALT 62 up from 20 and 24 respectively last year  Prostate: elevated but stable from previous  Blood pressure goal 130/80 or less.  Tobacco: don't! Alcohol: responsible moderation is ok for most adults - if you have concerns about your alcohol intake, please talk to me!  Exercise: as tolerated to reduce risk of cardiovascular disease and diabetes. Strength training will also prevent osteoporosis.  Mental health: if need for mental health care (medicines, counseling, other), or concerns about moods, please let me know!  Sexual / Reproductive health: if need for STD testing, or if concerns with libido/pain problems, please let me know!  Advanced Directive: Living Will and/or Healthcare Power of Attorney recommended for all adults, regardless of age or health.  Vaccines Flu vaccine: for almost everyone, every fall.  Shingles vaccine: all done!  Pneumonia vaccines: boosters after age 29. Tetanus booster: every 10 years, last on file 2029 COVID vaccine: THANKS for getting your vaccine! :) FALL BOOSTER STRONGLY RECOMMENDED  Cancer screenings  Colon cancer screening: Repeat 10/2022. Prostate cancer screening: PSA blood test age 26+ Lung cancer screening: not needed for non-smokers  Infection screenings  HIV: recommended screening at least once age 73-65, more often as needed. Gonorrhea/Chlamydia: screening as needed Hepatitis C: recommended once for everyone age 15-75 TB: certain at-risk populations

## 2021-09-04 NOTE — Progress Notes (Signed)
Paul Reed is a 65 y.o. male who presents to  [place] at [facility]  today, 09/04/21, seeking care for the following:  Annual physical  Monitor chronic conditions - see below      [place] with other pertinent findings:  The primary encounter diagnosis was Annual physical exam. Diagnoses of Need for influenza vaccination, Need for shingles vaccine, Type 2 diabetes mellitus without complication, without long-term current use of insulin ([place]), Hypothyroidism, unspecified type, Elevated PSA, Elevated liver enzymes, Hypertriglyceridemia, Hyperlipidemia associated with type 2 diabetes mellitus ([place]), Type 2 diabetes mellitus without complication, without long-term current use of insulin ([place]), and Hypothyroidism were also pertinent to this visit.   1. Annual physical exam UTD preventive care Need records from DM eye exam Need Tdap booster   2. Need for influenza vaccination Done today  3. Need for shingles vaccine Done today   4. Type 2 diabetes mellitus without complication, without long-term current use of insulin (HCC) A1C up! Hasn't been on Jardiance d/t not filled by pharmacy. Need to restart this, work on diet/exercise. If A1C not at goal, will need additional Rx next visit  5. Hypothyroidism, unspecified type Pt reports taking Rx Will increase dose  Poorly controlled hypothyroid may be contributing to higher TG/LDL but also he has been off statin...   6. Elevated PSA stable  7. Elevated liver enzymes Mild, seems most likely d/t higher TG/LDL I feel ok to restart statin since numbers previously ok   8. Hypertriglyceridemia 9. Hyperlipidemia associated with type 2 diabetes mellitus ([place]) Restart statin    Patient Instructions  General Preventive Care Most recent routine screening labs: reviewed.  Hypothyroid: TSH 10.65 Diabetes: A1C 10.4 Cholesterol: TG 326 up from 97 a year ago, LDL 82 up from 31 a year  ago Elevated liver enzymes: AST 62 and ALT 62 up from 20 and 24 respectively last year  Prostate: elevated but stable from previous  Blood pressure goal 130/80 or less.  Tobacco: don't! Alcohol: responsible moderation is ok for most adults - if you have concerns about your alcohol intake, please talk to me!  Exercise: as tolerated to reduce risk of cardiovascular disease and diabetes. Strength training will also prevent osteoporosis.  Mental health: if need for mental health care (medicines, counseling, other), or concerns about moods, please let me know!  Sexual / Reproductive health: if need for STD testing, or if concerns with libido/pain problems, please let me know!  Advanced Directive: Living Will and/or Healthcare Power of Attorney recommended for all adults, regardless of age or health.  Vaccines Flu vaccine: for almost everyone, every fall.  Shingles vaccine: all done!  Pneumonia vaccines: boosters after age 78. Tetanus booster: every 10 years, last on file 2029 COVID vaccine: THANKS for getting your vaccine! :) FALL BOOSTER STRONGLY RECOMMENDED  Cancer screenings  Colon cancer screening: Repeat 10/2022. Prostate cancer screening: PSA blood test age 44+ Lung cancer screening: not needed for non-smokers  Infection screenings  HIV: recommended screening at least once age 62-65, more often as needed. Gonorrhea/Chlamydia: screening as needed Hepatitis C: recommended once for everyone age 20-25 TB: certain at-risk populations  Orders Placed This Encounter  Procedures   Varicella-zoster vaccine IM (Shingrix)   Flu Vaccine QUAD 6+ mos PF IM (Fluarix Quad PF)   COMPLETE METABOLIC PANEL WITH GFR   Lipid panel   TSH   Hemoglobin A1c    Meds ordered this encounter  Medications   atorvastatin (LIPITOR) 40 MG  tablet    Sig: Take 1 tablet (40 mg total) by mouth daily.    Dispense:  90 tablet    Refill:  3   blood glucose meter kit and supplies KIT    Sig: Dispense based on  patient and insurance preference. Use up to four times daily as directed. Please include lancets, test strips, control solution.    Dispense:  1 each    Refill:  prn    Order Specific Question:   Number of strips    Answer:   100    Order Specific Question:   Number of lancets    Answer:   100   empagliflozin (JARDIANCE) 25 MG TABS tablet    Sig: Take 1 tablet (25 mg total) by mouth daily.    Dispense:  90 tablet    Refill:  3    IF FARXIGA PREFERRED, OK TO SUBSTITUTE FARXIGA 10 MG TABLETS TAKE 1 PO DAILY #90 REFILL x3   glucose blood test strip    Sig: Use up to 4 times per day as directed with glucometer. Disp: 100. Refill x99    Dispense:  100 strip    Refill:  99   levothyroxine (SYNTHROID) 125 MCG tablet    Sig: Take 1 tablet (125 mcg total) by mouth daily before breakfast.    Dispense:  90 tablet    Refill:  0   metFORMIN (GLUCOPHAGE) 1000 MG tablet    Sig: Take 1 tablet (1,000 mg total) by mouth daily.    Dispense:  90 tablet    Refill:  3      See below for relevant physical exam findings  See below for recent lab and imaging results reviewed  Medications, allergies, PMH, PSH, SocH, FamH reviewed below    Follow-up instructions: Return in about 3 months (around 12/04/2021) for FOLLOW UP DIABETES, HYPOTHYROID, CHOLESTEROL, LIVER W/ DR MATTHEWS - LABS PRIOR TO APPT, ORDERS IN.                                        Exam:  BP 122/80 (BP Location: Left Arm, Patient Position: Sitting, Cuff Size: Normal)   Pulse 85   Temp 98 F (36.7 C) (Oral)   Wt 208 lb 1.9 oz (94.4 kg)   BMI 32.60 kg/m  Constitutional: VS see above. General Appearance: alert, well-developed, well-nourished, NAD Neck: No masses, trachea midline.  Respiratory: Normal respiratory effort. no wheeze, no rhonchi, no rales Cardiovascular: S1/S2 normal, no murmur, no rub/gallop auscultated. RRR.  Musculoskeletal: Gait normal. Symmetric and independent movement of all  extremities Abdominal: non-tender, non-distended, no appreciable organomegaly, neg Murphy's, BS WNLx4, (+)HERNIA  Neurological: Normal balance/coordination. No tremor. Skin: warm, dry, intact.  Psychiatric: Normal judgment/insight. Normal mood and affect. Oriented x3.   No outpatient medications have been marked as taking for the 09/04/21 encounter (Office Visit) with Emeterio Reeve, DO.    No Known Allergies  Patient Active Problem List   Diagnosis Date Noted   Vitamin D deficiency 11/16/2017   Hyperlipidemia associated with type 2 diabetes mellitus ([place]) 11/16/2017   Elevated liver enzymes 11/16/2017   Annual physical exam 09/11/2016   Solitary pulmonary nodule 01/06/2016   Type 2 diabetes mellitus ([place]) 10/29/2015   Hyperglycemia 10/28/2015   Hypertriglyceridemia 02/08/2013   CALF PAIN, LEFT 10/30/2010   Elevated PSA 10/23/2008   Hypothyroidism 06/01/2007   OBESITY NOS 06/01/2007   NEPHROLITHIASIS  06/01/2007    Family History  Problem Relation Age of Onset   Cancer Father    Thyroid disease Unknown        mother    Social History   Tobacco Use  Smoking Status Never  Smokeless Tobacco Never    Past Surgical History:  Procedure Laterality Date   ESOPHAGUS SURGERY     Removed glass in infancy    Immunization History  Administered Date(s) Administered   Influenza Split 10/12/2011, 10/10/2012   Influenza Whole 10/12/2010   Influenza, Quadrivalent, Recombinant, Inj, Pf 10/19/2019   Influenza, Seasonal, Injecte, Preservative Fre 08/09/2013   Influenza,inj,Quad PF,6+ Mos 09/06/2014, 10/28/2015, 09/10/2016, 09/15/2017, 11/21/2018, 09/02/2020, 09/04/2021   Moderna Sars-Covid-2 Vaccination 03/21/2020, 04/18/2020   Pneumococcal Polysaccharide-23 11/21/2018   Td 06/04/2008   Zoster Recombinat (Shingrix) 11/30/2018, 09/04/2021    Recent Results (from the past 2160 hour(s))  CBC     Status: None   Collection Time: 08/31/21  9:45 AM  Result Value Ref Range   WBC  7.3 3.8 - 10.8 Thousand/uL   RBC 4.83 4.20 - 5.80 Million/uL   Hemoglobin 15.0 13.2 - 17.1 g/dL   HCT 46.1 38.5 - 50.0 %   MCV 95.4 80.0 - 100.0 fL   MCH 31.1 27.0 - 33.0 pg   MCHC 32.5 32.0 - 36.0 g/dL   RDW 12.0 11.0 - 15.0 %   Platelets 320 140 - 400 Thousand/uL   MPV 10.2 7.5 - 12.5 fL  COMPLETE METABOLIC PANEL WITH GFR     Status: Abnormal   Collection Time: 08/31/21  9:45 AM  Result Value Ref Range   Glucose, Bld 235 (H) 65 - 99 mg/dL    Comment: .            Fasting reference interval . For someone without known diabetes, a glucose value >125 mg/dL indicates that they may have diabetes and this should be confirmed with a follow-up test. .    BUN 11 7 - 25 mg/dL   Creat 0.87 0.70 - 1.35 mg/dL   eGFR 97 > OR = 60 mL/min/1.33m    Comment: The eGFR is based on the CKD-EPI 2021 equation. To calculate  the new eGFR from a previous Creatinine or Cystatin C result, go to https://www.kidney.org/professionals/ kdoqi/gfr%5Fcalculator    BUN/Creatinine Ratio NOT APPLICABLE 6 - 22 (calc)   Sodium 137 135 - 146 mmol/L   Potassium 4.3 3.5 - 5.3 mmol/L   Chloride 100 98 - 110 mmol/L   CO2 25 20 - 32 mmol/L   Calcium 9.6 8.6 - 10.3 mg/dL   Total Protein 7.7 6.1 - 8.1 g/dL   Albumin 4.4 3.6 - 5.1 g/dL   Globulin 3.3 1.9 - 3.7 g/dL (calc)   AG Ratio 1.3 1.0 - 2.5 (calc)   Total Bilirubin 0.7 0.2 - 1.2 mg/dL   Alkaline phosphatase (APISO) 82 35 - 144 U/L   AST 62 (H) 10 - 35 U/L   ALT 62 (H) 9 - 46 U/L  Lipid panel     Status: Abnormal   Collection Time: 08/31/21  9:45 AM  Result Value Ref Range   Cholesterol 171 <200 mg/dL   HDL 50 > OR = 40 mg/dL   Triglycerides 326 (H) <150 mg/dL    Comment: . If a non-fasting specimen was collected, consider repeat triglyceride testing on a fasting specimen if clinically indicated.  JYates Decampet al. J. of Clin. Lipidol. 26168;3:729-021 .Marland Kitchen   LDL Cholesterol (Calc) 82 mg/dL (calc)  Comment: Reference range: <100 . Desirable range  <100 mg/dL for primary prevention;   <70 mg/dL for patients with CHD or diabetic patients  with > or = 2 CHD risk factors. Marland Kitchen LDL-C is now calculated using the Martin-Hopkins  calculation, which is a validated novel method providing  better accuracy than the Friedewald equation in the  estimation of LDL-C.  Cresenciano Genre et al. Annamaria Helling. 7989;211(94): 2061-2068  (http://education.Quest[employer].com/faq/FAQ164)    Total CHOL/HDL Ratio 3.4 <5.0 (calc)   Non-HDL Cholesterol (Calc) 121 <130 mg/dL (calc)    Comment: For patients with diabetes plus 1 major ASCVD risk  factor, treating to a non-HDL-C goal of <100 mg/dL  (LDL-C of <70 mg/dL) is considered a therapeutic  option.   Hemoglobin A1c     Status: Abnormal   Collection Time: 08/31/21  9:45 AM  Result Value Ref Range   Hgb A1c MFr Bld 10.4 (H) <5.7 % of total Hgb    Comment: For someone without known diabetes, a hemoglobin A1c value of 6.5% or greater indicates that they may have  diabetes and this should be confirmed with a follow-up  test. . For someone with known diabetes, a value <7% indicates  that their diabetes is well controlled and a value  greater than or equal to 7% indicates suboptimal  control. A1c targets should be individualized based on  duration of diabetes, age, comorbid conditions, and  other considerations. . Currently, no consensus exists regarding use of hemoglobin A1c for diagnosis of diabetes for children. .    Mean Plasma Glucose 252 mg/dL   eAG (mmol/L) 13.9 mmol/L  TSH     Status: Abnormal   Collection Time: 08/31/21  9:45 AM  Result Value Ref Range   TSH 10.65 (H) 0.40 - 4.50 mIU/L  PSA, Total with Reflex to PSA, Free     Status: Abnormal   Collection Time: 08/31/21  9:45 AM  Result Value Ref Range   PSA, Total 5.7 (H) < OR = 4.0 ng/mL  reflex PSA, Free     Status: None   Collection Time: 08/31/21  9:45 AM  Result Value Ref Range   PSA, Free 1.7 ng/mL   PSA, % Free 30 >25 % (calc)    Comment:  . PSA(ng/mL)      Free PSA(%)     Estimated(x) Probability                                      of Cancer(as%) 0-2.5              (*)               Approx. 1 2.6-4.0(1)         0-27(2)                   24(3) 4.1-10(4)          0-10                      56                    11-15                     28                    16-20  20                    21-25                     16                    >or =26                   8 >10(+)             N/A                      >50 . References:(1)Catalona et al.:Urology 60: 469-474 (2002)            (2)Catalona et al.:J.Urol 168: 922-925 (2002)               Free PSA(%)   Sensitivity(%)  Specificity(%)               < or = 25          85              19               < or = 30          93               9            (3)Catalona et al.:JAMA 277: 1452-1455 (1997)            (4)Catalona et al.:JAMA 279: 4081-4481 (1998) . (x)These estimates vary with age, ethnicity, family     history and DRE results. (*)The  diagnostic usefulness of % Free PSA has not been    established in patients with total PSA below 2.6 ng/mL (+)In men with PSA above 10 ng/mL, prostate cancer risk is    determined by total PSA alone. . The Total PSA value from this assay system is  standardized against the equimolar PSA standard.  The test result will be approximately 20% higher  when compared to the [facility] Total PSA  (Siemens assay). Comparison of serial PSA results  should be interpreted with this fact in mind. Marland Kitchen PSA was performed using the Beckman Coulter Immunoassay method. Values obtained from different assay methods cannot be used interchangeably. PSA levels, regardless of value, should not be interpreted as absolute evidence of the presence or absence of disease. .     No results found.     All questions at time of visit were answered - patient instructed to contact office with any additional concerns or updates. ER/RTC  precautions were reviewed with the patient as applicable.   Please note: manual typing as well as voice recognition software may have been used to produce this document - typos may escape review. Please contact Dr. Sheppard Coil for any needed clarifications.

## 2021-09-16 IMAGING — DX DG SHOULDER 2+V*L*
3 series · 3 of 3 positions shown · non-contrast
Comparison: No prior.

CLINICAL DATA: Shoulder pain after injury.

EXAM:
LEFT SHOULDER - 2+ VIEW

[shoulder grashey]
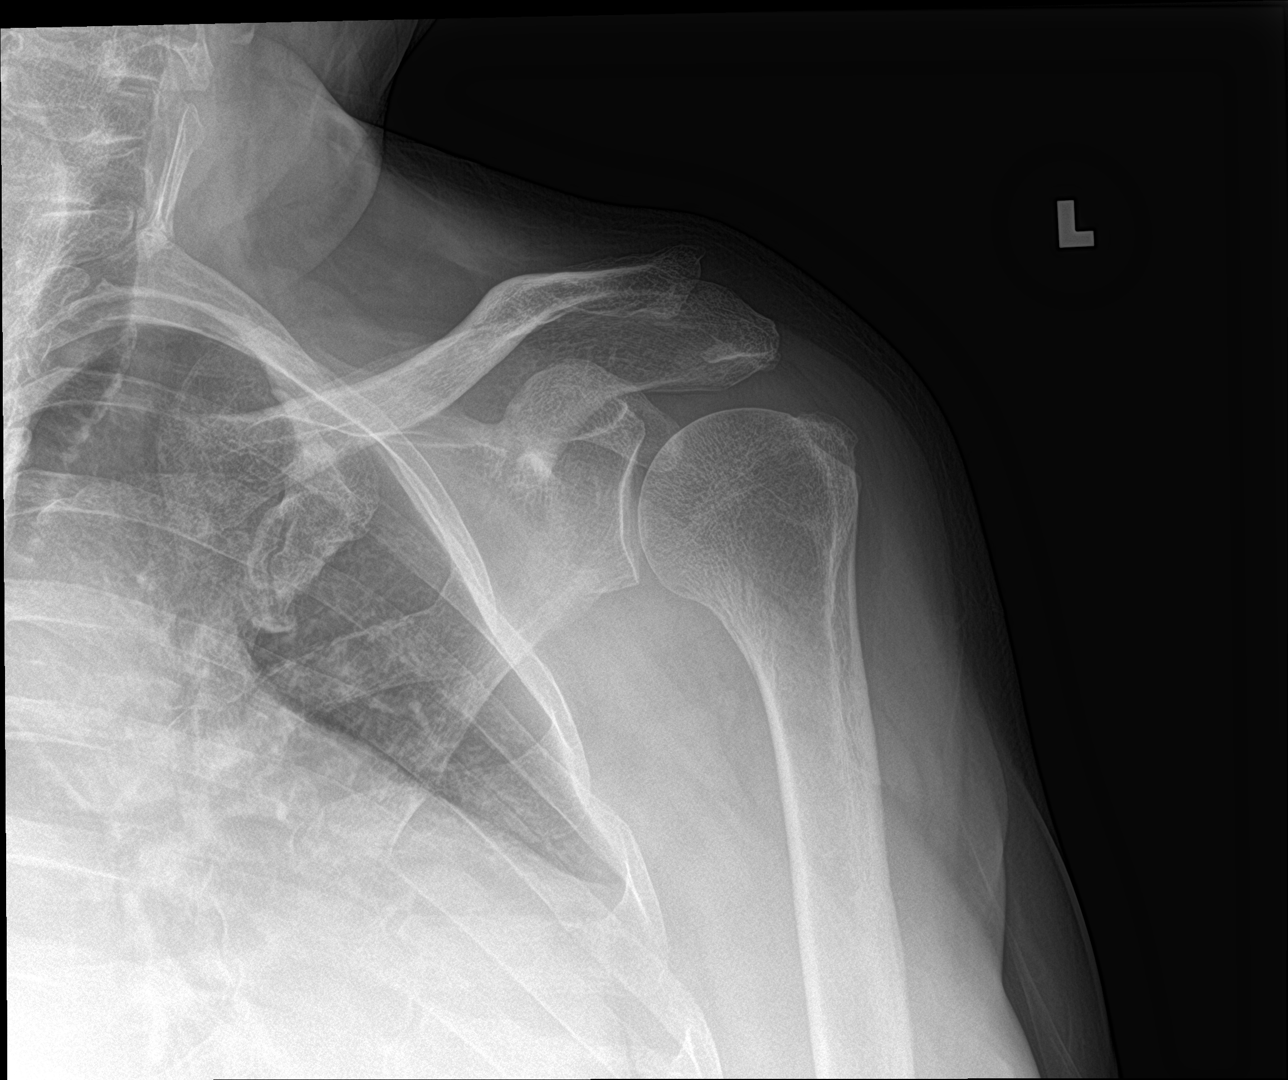

[shoulder y view]
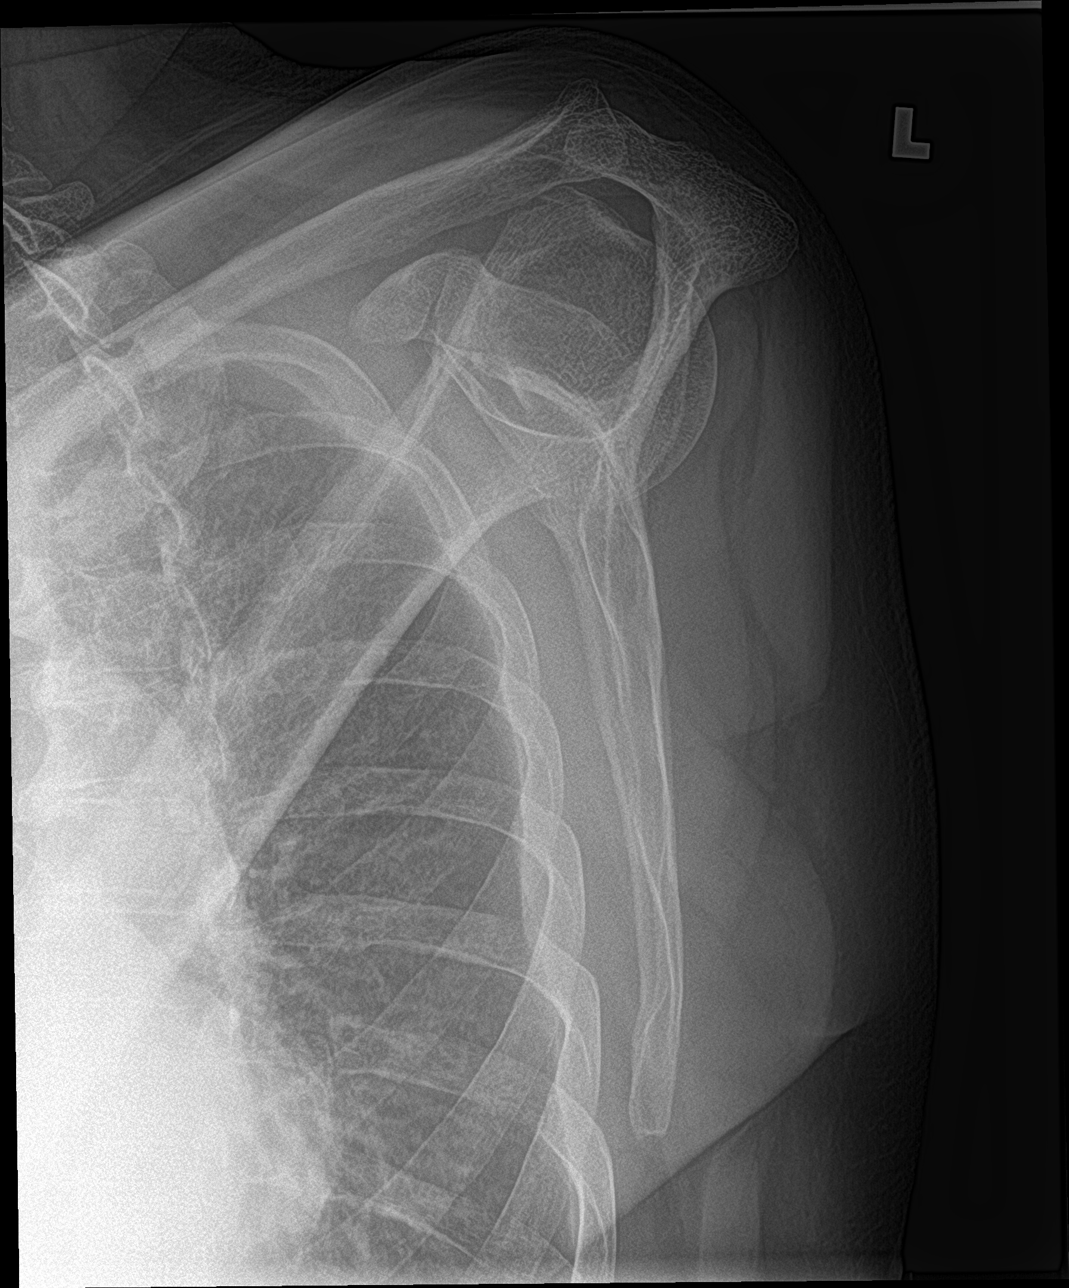

[shoulder axillary]
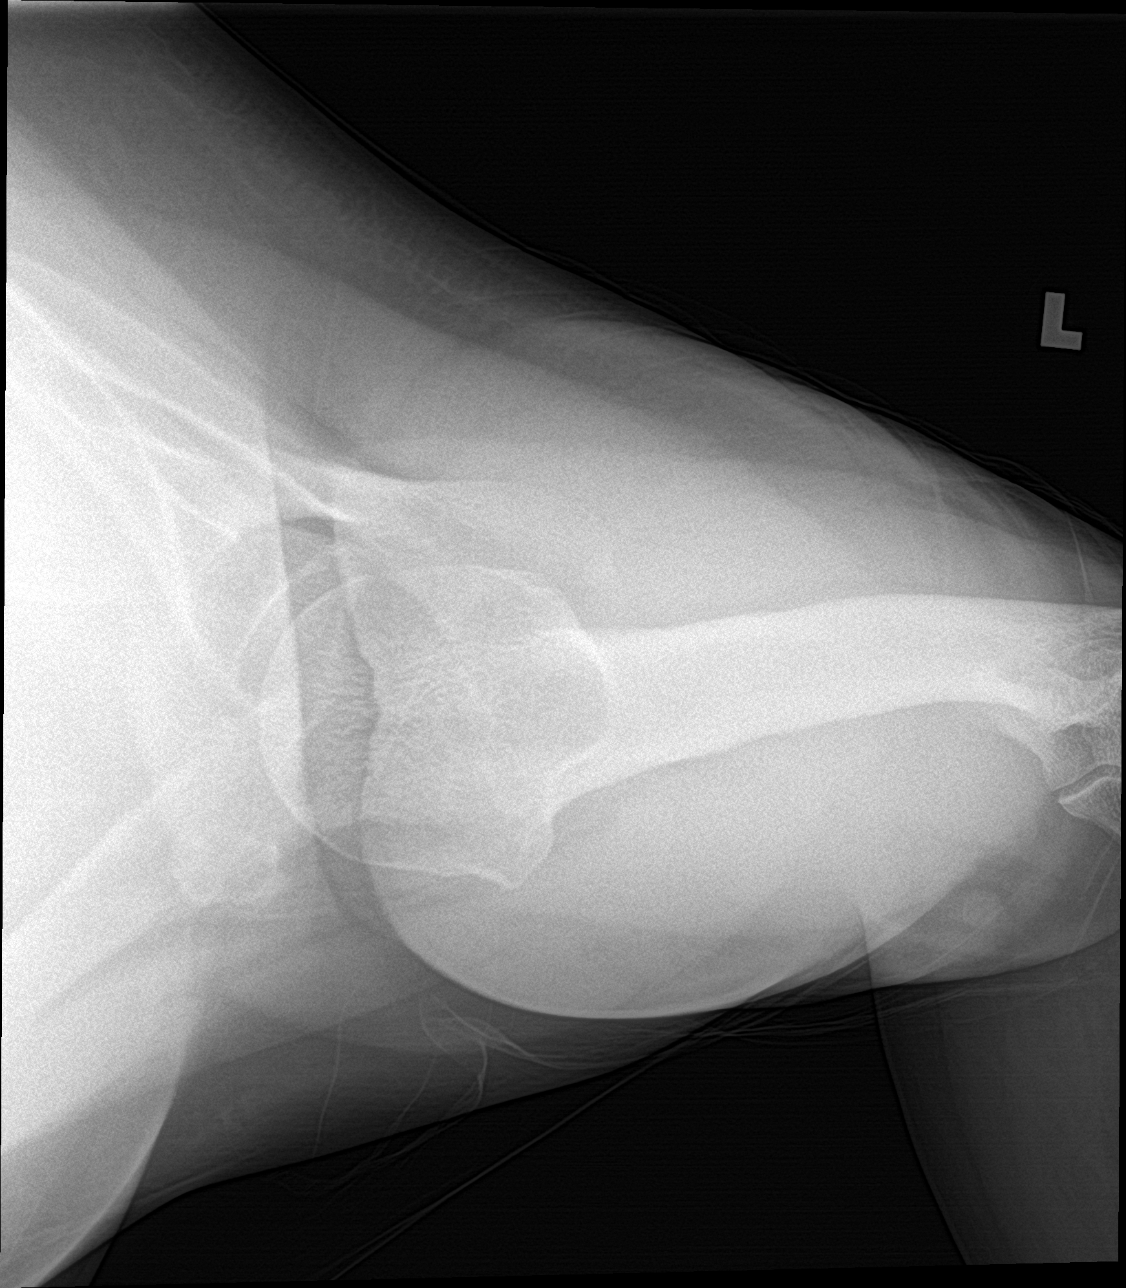

[3 of 3 positions shown; findings below may reference images not displayed]

FINDINGS: Mild subacromial spurring. Mild acromioclavicular and glenohumeral
degenerative change. Very faint lucency noted along the inferior lip
of the glenoid. Subtle nondisplaced fracture cannot be excluded. No
evidence of dislocation or separation.
IMPRESSION: 1. Mild subacromial spurring. Mild acromioclavicular glenohumeral
degenerative change.

2. Very faint lucency noted along the inferior lip of the glenoid.
Subtle nondisplaced fracture cannot be excluded.

## 2021-09-17 ENCOUNTER — Other Ambulatory Visit: Payer: Self-pay | Admitting: Osteopathic Medicine

## 2021-09-17 DIAGNOSIS — E039 Hypothyroidism, unspecified: Secondary | ICD-10-CM

## 2021-09-29 ENCOUNTER — Other Ambulatory Visit: Payer: Self-pay | Admitting: Osteopathic Medicine

## 2021-11-27 ENCOUNTER — Other Ambulatory Visit: Payer: Self-pay | Admitting: Osteopathic Medicine

## 2021-11-27 DIAGNOSIS — E039 Hypothyroidism, unspecified: Secondary | ICD-10-CM

## 2021-12-07 ENCOUNTER — Other Ambulatory Visit: Payer: Self-pay

## 2021-12-07 ENCOUNTER — Ambulatory Visit (INDEPENDENT_AMBULATORY_CARE_PROVIDER_SITE_OTHER): Payer: 59 | Admitting: Family Medicine

## 2021-12-07 ENCOUNTER — Encounter: Payer: Self-pay | Admitting: Family Medicine

## 2021-12-07 VITALS — BP 122/70 | HR 87 | Temp 97.7°F | Ht 67.0 in | Wt 200.0 lb

## 2021-12-07 DIAGNOSIS — E1169 Type 2 diabetes mellitus with other specified complication: Secondary | ICD-10-CM

## 2021-12-07 DIAGNOSIS — E039 Hypothyroidism, unspecified: Secondary | ICD-10-CM

## 2021-12-07 DIAGNOSIS — E119 Type 2 diabetes mellitus without complications: Secondary | ICD-10-CM

## 2021-12-07 DIAGNOSIS — E785 Hyperlipidemia, unspecified: Secondary | ICD-10-CM | POA: Diagnosis not present

## 2021-12-07 LAB — POCT GLYCOSYLATED HEMOGLOBIN (HGB A1C): Hemoglobin A1C: 8.6 % — AB (ref 4.0–5.6)

## 2021-12-07 MED ORDER — RYBELSUS 7 MG PO TABS: 7.0000 mg | ORAL_TABLET | Freq: Every day | ORAL | 1 refills | Status: DC

## 2021-12-07 MED ORDER — RYBELSUS 3 MG PO TABS: 3.0000 mg | ORAL_TABLET | Freq: Every day | ORAL | 0 refills | Status: DC

## 2021-12-07 NOTE — Patient Instructions (Signed)
Continue jardiance and metformin  Add rybelsus.  Start 3mg  daily for 1 month then increase to 7mg .  Follow up in 3 months.

## 2021-12-07 NOTE — Assessment & Plan Note (Signed)
Stable with current dose of levothyroxine.  Continue current strength.

## 2021-12-07 NOTE — Progress Notes (Signed)
Paul Reed - 65 y.o. male MRN [id number]  Date of birth: 08-17-1956  Subjective Chief Complaint  Patient presents with   Diabetes   Hyperlipidemia    HPI Paul Reed is a 65 year old male here today for follow-up visit.  He is transferring care from Dr. Lyn Hollingshead.  He has history of type 2 diabetes, hypothyroidism and hyperlipidemia.  Reports he is feeling well at this time.  A1c has improved since last visit blood sugars still remain elevated.  He has restarted Jardiance and continues on metformin.  No side effects noted with medication.  Taking levothyroxine daily as directed.  Tolerating atorvastatin for management of hyperlipidemia.  ROS:  A comprehensive ROS was completed and negative except as noted per HPI  No Known Allergies  Past Medical History:  Diagnosis Date   Bursitis    Chronic kidney disease    stones   ELEVATED PROSTATE SPECIFIC ANTIGEN 10/23/2008   Biopsy planned with Dr. Sherryl Barters ([facility])     Hypothyroidism    Impaired fasting glucose    Obesity    OSA on CPAP    13 years    Past Surgical History:  Procedure Laterality Date   ESOPHAGUS SURGERY     Removed glass in infancy    Social History   Socioeconomic History   Marital status: Married    Spouse name: Not on file   Number of children: Not on file   Years of education: Not on file   Highest education level: Not on file  Occupational History   Not on file  Tobacco Use   Smoking status: Never   Smokeless tobacco: Never  Substance and Sexual Activity   Alcohol use: Yes    Comment: very infrequent   Drug use: No   Sexual activity: Yes    Comment: Engineer, structural, [employer], married, 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, noexercise, poor diet, 3 soda's daily  Other Topics Concern   Not on file  Social History Narrative   Not on file   Social Determinants of Health   Financial Resource Strain: Not on file  Food Insecurity: Not on file  Transportation Needs: Not on file  Physical  Activity: Not on file  Stress: Not on file  Social Connections: Not on file    Family History  Problem Relation Age of Onset   Cancer Father    Thyroid disease Unknown        mother    Health Maintenance  Topic Date Due   OPHTHALMOLOGY EXAM  11/16/2018   Pneumococcal Vaccine 19-56 Years old (2 - PCV) 11/22/2019   URINE MICROALBUMIN  11/22/2019   COVID-19 Vaccine (3 - Booster for Moderna series) 06/13/2020   TETANUS/TDAP  08/04/2022 (Originally 06/04/2018)   HEMOGLOBIN A1C  06/07/2022   Fecal DNA (Cologuard)  11/07/2022   FOOT EXAM  12/07/2022   INFLUENZA VACCINE  Completed   Hepatitis C Screening  Completed   HIV Screening  Completed   Zoster Vaccines- Shingrix  Completed   HPV VACCINES  Aged Out   COLONOSCOPY (Pts 45-61yrs Insurance coverage will need to be confirmed)  Discontinued     ----------------------------------------------------------------------------------------------------------------------------------------------------------------------------------------------------------------- Physical Exam BP 122/70 (BP Location: Left Arm, Patient Position: Sitting, Cuff Size: Normal)    Pulse 87    Temp 97.7 F (36.5 C)    Ht 5\' 7"  (1.702 m)    Wt 200 lb (90.7 kg)    SpO2 97%    BMI 31.32 kg/m   Physical Exam Constitutional:  Appearance: Normal appearance.  Eyes:     General: No scleral icterus. Cardiovascular:     Rate and Rhythm: Normal rate and regular rhythm.  Pulmonary:     Effort: Pulmonary effort is normal.     Breath sounds: Normal breath sounds.  Musculoskeletal:     Cervical back: Neck supple.  Neurological:     General: No focal deficit present.     Mental Status: He is alert.  Psychiatric:        Mood and Affect: Mood normal.        Behavior: Behavior normal.     ------------------------------------------------------------------------------------------------------------------------------------------------------------------------------------------------------------------- Assessment and Plan  Hyperlipidemia associated with type 2 diabetes mellitus (HCC) Doing well with a atorvastatin for management of hyperlipidemia.  Continue at current strength.  Type 2 diabetes mellitus (HCC) A1c has improved however would like to see better improvement of his glucose.  Adding Rybelsus 3 mg x 1 month with increase to 7 mg thereafter.  Continue to work on dietary change.  Continue Jardiance and metformin at current strength.  Hypothyroidism Stable with current dose of levothyroxine.  Continue current strength.   Meds ordered this encounter  Medications   Semaglutide (RYBELSUS) 3 MG TABS    Sig: Take 3 mg by mouth daily. Take x1 month then increase to 7mg     Dispense:  30 tablet    Refill:  0   Semaglutide (RYBELSUS) 7 MG TABS    Sig: Take 7 mg by mouth daily.    Dispense:  30 tablet    Refill:  1    Return in about 3 months (around 03/07/2022) for T2DM.    This visit occurred during the SARS-CoV-2 public health emergency.  Safety protocols were in place, including screening questions prior to the visit, additional usage of staff PPE, and extensive cleaning of exam room while observing appropriate contact time as indicated for disinfecting solutions.

## 2021-12-07 NOTE — Assessment & Plan Note (Signed)
Doing well with a atorvastatin for management of hyperlipidemia.  Continue at current strength.

## 2021-12-07 NOTE — Assessment & Plan Note (Signed)
A1c has improved however would like to see better improvement of his glucose.  Adding Rybelsus 3 mg x 1 month with increase to 7 mg thereafter.  Continue to work on dietary change.  Continue Jardiance and metformin at current strength.

## 2021-12-09 ENCOUNTER — Other Ambulatory Visit: Payer: Self-pay | Admitting: Family Medicine

## 2021-12-09 ENCOUNTER — Other Ambulatory Visit: Payer: Self-pay

## 2021-12-09 MED ORDER — RYBELSUS 3 MG PO TABS: 3.0000 mg | ORAL_TABLET | Freq: Every day | ORAL | 0 refills | Status: DC

## 2021-12-09 NOTE — Telephone Encounter (Signed)
[facility] pharmacy requesting an alternative rx for Rybelus per patient request.

## 2021-12-09 NOTE — Telephone Encounter (Signed)
Why is alternative requested?  Not covered, too expensive, not in stock?

## 2021-12-10 NOTE — Telephone Encounter (Signed)
Can we see if this needs a PA?

## 2021-12-28 ENCOUNTER — Telehealth: Payer: Self-pay

## 2021-12-28 NOTE — Telephone Encounter (Signed)
Medication: Semaglutide (RYBELSUS) 3 MG TABS Prior authorization submitted via CoverMyMeds on 12/28/2021 PA submission pending

## 2022-01-28 LAB — HM DIABETES EYE EXAM

## 2022-02-26 ENCOUNTER — Other Ambulatory Visit: Payer: Self-pay | Admitting: Medical-Surgical

## 2022-02-26 DIAGNOSIS — E039 Hypothyroidism, unspecified: Secondary | ICD-10-CM

## 2022-03-08 ENCOUNTER — Other Ambulatory Visit: Payer: Self-pay

## 2022-03-08 ENCOUNTER — Encounter: Payer: Self-pay | Admitting: Family Medicine

## 2022-03-08 ENCOUNTER — Ambulatory Visit (INDEPENDENT_AMBULATORY_CARE_PROVIDER_SITE_OTHER): Payer: 59 | Admitting: Family Medicine

## 2022-03-08 VITALS — BP 101/64 | HR 74 | Ht 67.0 in | Wt 195.0 lb

## 2022-03-08 DIAGNOSIS — E119 Type 2 diabetes mellitus without complications: Secondary | ICD-10-CM

## 2022-03-08 DIAGNOSIS — E785 Hyperlipidemia, unspecified: Secondary | ICD-10-CM

## 2022-03-08 DIAGNOSIS — E1169 Type 2 diabetes mellitus with other specified complication: Secondary | ICD-10-CM | POA: Diagnosis not present

## 2022-03-08 LAB — POCT GLYCOSYLATED HEMOGLOBIN (HGB A1C): HbA1c, POC (controlled diabetic range): 7.7 % — AB (ref 0.0–7.0)

## 2022-03-08 LAB — POCT UA - MICROALBUMIN
Albumin/Creatinine Ratio, Urine, POC: <30
Creatinine, POC: 100 mg/dL
Microalbumin Ur, POC: 10 mg/L

## 2022-03-08 MED ORDER — GLIPIZIDE 5 MG PO TABS
5.0000 mg | ORAL_TABLET | Freq: Every day | ORAL | 1 refills | Status: DC
Start: 2022-03-08 — End: 2022-08-26

## 2022-03-08 NOTE — Progress Notes (Signed)
?Paul Reed - 66 y.o. male MRN [id number]  Date of birth: 23-Dec-1955 ? ?Subjective ?Chief Complaint  ?Patient presents with  ? Diabetes  ? ? ?HPI ?Paul Reed is a 66 year old male here today for follow-up visit.  Reports he is doing okay at this time.  Continues to take Jardiance and metformin.  He has made some slight changes to his diet.  He has started to work on incorporating some exercise and plans to increase this as the weather gets warmer.  We did try adding Rybelsus previously however insurance would not cover this. ? ?ROS:  A comprehensive ROS was completed and negative except as noted per HPI ? ?No Known Allergies ? ?Past Medical History:  ?Diagnosis Date  ? Bursitis   ? Chronic kidney disease   ? stones  ? ELEVATED PROSTATE SPECIFIC ANTIGEN 10/23/2008  ? Biopsy planned with Dr. Pilar Jarvis ([facility])    ? Hypothyroidism   ? Impaired fasting glucose   ? Obesity   ? OSA on CPAP   ? 13 years  ? ? ?Past Surgical History:  ?Procedure Laterality Date  ? ESOPHAGUS SURGERY    ? Removed glass in infancy  ? ? ?Social History  ? ?Socioeconomic History  ? Marital status: Married  ?  Spouse name: Not on file  ? Number of children: Not on file  ? Years of education: Not on file  ? Highest education level: Not on file  ?Occupational History  ? Not on file  ?Tobacco Use  ? Smoking status: Never  ? Smokeless tobacco: Never  ?Substance and Sexual Activity  ? Alcohol use: Yes  ?  Comment: very infrequent  ? Drug use: No  ? Sexual activity: Yes  ?  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 Not on file  ?Social History Narrative  ? Not on file  ? ?Social Determinants of Health  ? ?Financial Resource Strain: Not on file  ?Food Insecurity: Not on file  ?Transportation Needs: Not on file  ?Physical Activity: Not on file  ?Stress: Not on file  ?Social Connections: Not on file  ? ? ?Family History  ?Problem Relation Age of Onset  ? Cancer  Father   ? Thyroid disease Unknown   ?     mother  ? ? ?Health Maintenance  ?Topic Date Due  ? COVID-19 Vaccine (4 - Booster for Moderna series) 03/12/2022  ? TETANUS/TDAP  08/04/2022 (Originally 06/04/2018)  ? HEMOGLOBIN A1C  09/08/2022  ? Fecal DNA (Cologuard)  11/07/2022  ? FOOT EXAM  12/07/2022  ? OPHTHALMOLOGY EXAM  01/28/2023  ? URINE MICROALBUMIN  03/09/2023  ? INFLUENZA VACCINE  Completed  ? Hepatitis C Screening  Completed  ? HIV Screening  Completed  ? Zoster Vaccines- Shingrix  Completed  ? HPV VACCINES  Aged Out  ? COLONOSCOPY (Pts 45-4yrs Insurance coverage will need to be confirmed)  Discontinued  ? ? ? ?----------------------------------------------------------------------------------------------------------------------------------------------------------------------------------------------------------------- ?Physical Exam ?BP 101/64 (BP Location: Left Arm, Patient Position: Sitting, Cuff Size: Normal)   Pulse 74   Ht 5\' 7"  (1.702 m)   Wt 195 lb (88.5 kg)   SpO2 99%   BMI 30.54 kg/m?  ? ?Physical Exam ?Constitutional:   ?   Appearance: Normal appearance.  ?HENT:  ?   Head: Normocephalic and atraumatic.  ?Eyes:  ?   General: No scleral icterus. ?Cardiovascular:  ?   Rate and Rhythm: Normal rate and regular  rhythm.  ?Pulmonary:  ?   Effort: Pulmonary effort is normal.  ?   Breath sounds: Normal breath sounds.  ?Musculoskeletal:  ?   Cervical back: Neck supple.  ?Neurological:  ?   General: No focal deficit present.  ?   Mental Status: He is alert.  ?Psychiatric:     ?   Mood and Affect: Mood normal.     ?   Behavior: Behavior normal.  ? ? ?------------------------------------------------------------------------------------------------------------------------------------------------------------------------------------------------------------------- ?Assessment and Plan ? ?Type 2 diabetes mellitus ([place]) ?Control of his diabetes has improved some since last visit.  Adding glipizide 5 mg daily.  He  will let me know if he has any hypoglycemia with this.  Continue metformin and Jardiance at current strength. ? ?Hyperlipidemia associated with type 2 diabetes mellitus ([place]) ?Tolerating atorvastatin well.  Recommend continuation at current strength. ? ? ?Meds ordered this encounter  ?Medications  ? glipiZIDE (GLUCOTROL) 5 MG tablet  ?  Sig: Take 1 tablet (5 mg total) by mouth daily before breakfast.  ?  Dispense:  90 tablet  ?  Refill:  1  ? ? ?Return in about 3 months (around 06/08/2022) for T2DM. ? ? ? ?This visit occurred during the SARS-CoV-2 public health emergency.  Safety protocols were in place, including screening questions prior to the visit, additional usage of staff PPE, and extensive cleaning of exam room while observing appropriate contact time as indicated for disinfecting solutions.  ? ?

## 2022-03-08 NOTE — Progress Notes (Signed)
Started PA for Rybelsus 3mg .  ? ?Denied by insurance 03/08/22 ?

## 2022-03-08 NOTE — Assessment & Plan Note (Signed)
Tolerating atorvastatin well.  Recommend continuation at current strength. 

## 2022-03-08 NOTE — Assessment & Plan Note (Signed)
Control of his diabetes has improved some since last visit.  Adding glipizide 5 mg daily.  He will let me know if he has any hypoglycemia with this.  Continue metformin and Jardiance at current strength. ?

## 2022-03-08 NOTE — Patient Instructions (Addendum)
Continue to work on dietary and exercise changes. ?Add glipizide 5mg  daily. ?Follow up in 3-4 months.  ?

## 2022-03-28 DIAGNOSIS — Z87442 Personal history of urinary calculi: Secondary | ICD-10-CM | POA: Diagnosis not present

## 2022-03-28 DIAGNOSIS — N132 Hydronephrosis with renal and ureteral calculous obstruction: Secondary | ICD-10-CM | POA: Diagnosis not present

## 2022-03-28 DIAGNOSIS — N365 Urethral false passage: Secondary | ICD-10-CM | POA: Diagnosis not present

## 2022-03-28 DIAGNOSIS — N201 Calculus of ureter: Secondary | ICD-10-CM | POA: Diagnosis not present

## 2022-03-28 DIAGNOSIS — R109 Unspecified abdominal pain: Secondary | ICD-10-CM | POA: Diagnosis not present

## 2022-03-29 DIAGNOSIS — E119 Type 2 diabetes mellitus without complications: Secondary | ICD-10-CM | POA: Diagnosis not present

## 2022-03-29 DIAGNOSIS — N201 Calculus of ureter: Secondary | ICD-10-CM | POA: Diagnosis not present

## 2022-03-30 DIAGNOSIS — E119 Type 2 diabetes mellitus without complications: Secondary | ICD-10-CM | POA: Diagnosis not present

## 2022-03-30 DIAGNOSIS — N201 Calculus of ureter: Secondary | ICD-10-CM | POA: Diagnosis not present

## 2022-04-09 DIAGNOSIS — N200 Calculus of kidney: Secondary | ICD-10-CM | POA: Diagnosis not present

## 2022-05-24 ENCOUNTER — Other Ambulatory Visit: Payer: Self-pay | Admitting: Family Medicine

## 2022-05-24 DIAGNOSIS — E039 Hypothyroidism, unspecified: Secondary | ICD-10-CM

## 2022-06-08 ENCOUNTER — Ambulatory Visit (INDEPENDENT_AMBULATORY_CARE_PROVIDER_SITE_OTHER): Payer: 59 | Admitting: Family Medicine

## 2022-06-08 ENCOUNTER — Encounter: Payer: Self-pay | Admitting: Family Medicine

## 2022-06-08 VITALS — BP 121/76 | HR 71 | Ht 67.0 in | Wt 201.0 lb

## 2022-06-08 DIAGNOSIS — E1169 Type 2 diabetes mellitus with other specified complication: Secondary | ICD-10-CM

## 2022-06-08 DIAGNOSIS — E785 Hyperlipidemia, unspecified: Secondary | ICD-10-CM | POA: Diagnosis not present

## 2022-06-08 DIAGNOSIS — R972 Elevated prostate specific antigen [PSA]: Secondary | ICD-10-CM

## 2022-06-08 DIAGNOSIS — E119 Type 2 diabetes mellitus without complications: Secondary | ICD-10-CM

## 2022-06-08 DIAGNOSIS — E039 Hypothyroidism, unspecified: Secondary | ICD-10-CM

## 2022-06-08 LAB — POCT GLYCOSYLATED HEMOGLOBIN (HGB A1C): HbA1c, POC (controlled diabetic range): 6.5 % (ref 0.0–7.0)

## 2022-06-08 NOTE — Assessment & Plan Note (Signed)
Feels good with current dose of levothyroxine.  Will plan to continue at current strength.  Updated TSH ordered.

## 2022-06-08 NOTE — Assessment & Plan Note (Signed)
Tolerating atorvastatin at current strength.  Updated lipid panel ordered.

## 2022-06-08 NOTE — Patient Instructions (Signed)
Continue current medications.  See me again in about 3 months.  Have fasting labs prior to next appt.

## 2022-06-08 NOTE — Progress Notes (Signed)
Paul Reed - 66 y.o. male MRN [id number]  Date of birth: 01-19-1956  Subjective No chief complaint on file.   HPI Paul Reed is a 66 y.o. male here today for follow up visit.  Has been dealing with kidney stone.  Had to had stent and eventually lithotripsy.  Follow up with urology next week.   He is doing well with  jardiance, metformin and glipizide for management of diabetes.  Insurance would not cover rybelsus previously. Last a1c 7.7%.  Blood sugars at home are looking ok.  A1c today is down to 6.5%  Denies symptoms of hypoglycemia.  Feels good with current dose of levothyroxine.   Tolerating atorvastatin well for management of HLD.  ROS:  A comprehensive ROS was completed and negative except as noted per HPI  No Known Allergies  Past Medical History:  Diagnosis Date   Bursitis    Chronic kidney disease    stones   ELEVATED PROSTATE SPECIFIC ANTIGEN 10/23/2008   Biopsy planned with Dr. Sherryl Barters ([facility])     Hypothyroidism    Impaired fasting glucose    Obesity    OSA on CPAP    13 years    Past Surgical History:  Procedure Laterality Date   ESOPHAGUS SURGERY     Removed glass in infancy    Social History   Socioeconomic History   Marital status: Married    Spouse name: Not on file   Number of children: Not on file   Years of education: Not on file   Highest education level: Not on file  Occupational History   Not on file  Tobacco Use   Smoking status: Never   Smokeless tobacco: Never  Substance and Sexual Activity   Alcohol use: Yes    Comment: very infrequent   Drug use: No   Sexual activity: Yes    Comment: Engineer, structural, [employer], married, 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, noexercise, poor diet, 3 soda's daily  Other Topics Concern   Not on file  Social History Narrative   Not on file   Social Determinants of Health   Financial Resource Strain: Not on file  Food Insecurity: Not on file  Transportation Needs: Not on file  Physical  Activity: Not on file  Stress: Not on file  Social Connections: Not on file    Family History  Problem Relation Age of Onset   Cancer Father    Thyroid disease Unknown        mother    Health Maintenance  Topic Date Due   COVID-19 Vaccine (4 - Moderna series) 03/12/2022   TETANUS/TDAP  08/04/2022 (Originally 06/04/2018)   INFLUENZA VACCINE  07/20/2022   HEMOGLOBIN A1C  09/08/2022   Fecal DNA (Cologuard)  11/07/2022   FOOT EXAM  12/07/2022   OPHTHALMOLOGY EXAM  01/28/2023   URINE MICROALBUMIN  03/09/2023   Hepatitis C Screening  Completed   HIV Screening  Completed   Zoster Vaccines- Shingrix  Completed   HPV VACCINES  Aged Out   COLONOSCOPY (Pts 45-60yrs Insurance coverage will need to be confirmed)  Discontinued     ----------------------------------------------------------------------------------------------------------------------------------------------------------------------------------------------------------------- Physical Exam BP 121/76 (BP Location: Left Arm, Patient Position: Sitting, Cuff Size: Normal)   Pulse 71   Ht 5\' 7"  (1.702 m)   Wt 201 lb (91.2 kg)   SpO2 98%   BMI 31.48 kg/m   Physical Exam Constitutional:      Appearance: Normal appearance.  Eyes:     General: No scleral icterus.  Cardiovascular:     Rate and Rhythm: Normal rate and regular rhythm.  Pulmonary:     Effort: Pulmonary effort is normal.     Breath sounds: Normal breath sounds.  Musculoskeletal:     Cervical back: Neck supple.  Skin:    General: Skin is warm and dry.  Neurological:     General: No focal deficit present.     Mental Status: He is alert.  Psychiatric:        Mood and Affect: Mood normal.        Behavior: Behavior normal.     ------------------------------------------------------------------------------------------------------------------------------------------------------------------------------------------------------------------- Assessment and  Plan  Type 2 diabetes mellitus ([facility]) Blood sugars have improved nicely.  We did discuss potentially adding a GLP-1 in the future to replace glipizide and help with weight loss.  He would like to wait until he has transitioned to medicare.  Continue with dietary changes.   Hypothyroidism Feels good with current dose of levothyroxine.  Will plan to continue at current strength.  Updated TSH ordered.   Hyperlipidemia associated with type 2 diabetes mellitus ([facility]) Tolerating atorvastatin at current strength.  Updated lipid panel ordered.    No orders of the defined types were placed in this encounter.   Return in about 3 months (around 09/08/2022) for T2DM.    This visit occurred during the SARS-CoV-2 public health emergency.  Safety protocols were in place, including screening questions prior to the visit, additional usage of staff PPE, and extensive cleaning of exam room while observing appropriate contact time as indicated for disinfecting solutions.

## 2022-06-08 NOTE — Assessment & Plan Note (Signed)
Blood sugars have improved nicely.  We did discuss potentially adding a GLP-1 in the future to replace glipizide and help with weight loss.  He would like to wait until he has transitioned to medicare.  Continue with dietary changes.

## 2022-06-15 DIAGNOSIS — N200 Calculus of kidney: Secondary | ICD-10-CM | POA: Diagnosis not present

## 2022-06-15 DIAGNOSIS — Z960 Presence of urogenital implants: Secondary | ICD-10-CM | POA: Diagnosis not present

## 2022-06-15 DIAGNOSIS — R109 Unspecified abdominal pain: Secondary | ICD-10-CM | POA: Diagnosis not present

## 2022-06-21 ENCOUNTER — Other Ambulatory Visit: Payer: Self-pay | Admitting: Family Medicine

## 2022-06-21 DIAGNOSIS — E039 Hypothyroidism, unspecified: Secondary | ICD-10-CM

## 2022-06-21 NOTE — Telephone Encounter (Signed)
Needs to have labs completed.

## 2022-06-30 DIAGNOSIS — N200 Calculus of kidney: Secondary | ICD-10-CM | POA: Diagnosis not present

## 2022-07-18 ENCOUNTER — Other Ambulatory Visit: Payer: Self-pay | Admitting: Family Medicine

## 2022-07-18 DIAGNOSIS — E039 Hypothyroidism, unspecified: Secondary | ICD-10-CM

## 2022-08-23 ENCOUNTER — Other Ambulatory Visit: Payer: Self-pay | Admitting: Family Medicine

## 2022-08-23 ENCOUNTER — Other Ambulatory Visit: Payer: Self-pay | Admitting: Osteopathic Medicine

## 2022-08-23 DIAGNOSIS — E1169 Type 2 diabetes mellitus with other specified complication: Secondary | ICD-10-CM

## 2022-08-23 DIAGNOSIS — E119 Type 2 diabetes mellitus without complications: Secondary | ICD-10-CM

## 2022-08-31 DIAGNOSIS — E1169 Type 2 diabetes mellitus with other specified complication: Secondary | ICD-10-CM | POA: Diagnosis not present

## 2022-08-31 DIAGNOSIS — E119 Type 2 diabetes mellitus without complications: Secondary | ICD-10-CM | POA: Diagnosis not present

## 2022-08-31 DIAGNOSIS — E039 Hypothyroidism, unspecified: Secondary | ICD-10-CM | POA: Diagnosis not present

## 2022-08-31 DIAGNOSIS — E785 Hyperlipidemia, unspecified: Secondary | ICD-10-CM | POA: Diagnosis not present

## 2022-09-01 LAB — CBC WITH DIFFERENTIAL/PLATELET
Absolute Monocytes: 632 cells/uL (ref 200–950)
Basophils Absolute: 16 cells/uL (ref 0–200)
Basophils Relative: 0.2 %
Eosinophils Absolute: 96 cells/uL (ref 15–500)
Eosinophils Relative: 1.2 %
HCT: 47.9 % (ref 38.5–50.0)
Hemoglobin: 16 g/dL (ref 13.2–17.1)
Lymphs Abs: 2776 cells/uL (ref 850–3900)
MCH: 30.9 pg (ref 27.0–33.0)
MCHC: 33.4 g/dL (ref 32.0–36.0)
MCV: 92.6 fL (ref 80.0–100.0)
MPV: 9.5 fL (ref 7.5–12.5)
Monocytes Relative: 7.9 %
Neutro Abs: 4480 cells/uL (ref 1500–7800)
Neutrophils Relative %: 56 %
Platelets: 303 10*3/uL (ref 140–400)
RBC: 5.17 10*6/uL (ref 4.20–5.80)
RDW: 12.2 % (ref 11.0–15.0)
Total Lymphocyte: 34.7 %
WBC: 8 10*3/uL (ref 3.8–10.8)

## 2022-09-01 LAB — LIPID PANEL W/REFLEX DIRECT LDL
Cholesterol: 102 mg/dL (ref <200)
HDL: 52 mg/dL (ref >=40)
LDL Cholesterol (Calc): 26 mg/dL (calc)
Non-HDL Cholesterol (Calc): 50 mg/dL (calc) (ref <130)
Total CHOL/HDL Ratio: 2 (calc) (ref <5.0)
Triglycerides: 154 mg/dL — ABNORMAL HIGH (ref <150)

## 2022-09-01 LAB — COMPLETE METABOLIC PANEL WITH GFR
AG Ratio: 1.5 (calc) (ref 1.0–2.5)
ALT: 28 U/L (ref 9–46)
AST: 22 U/L (ref 10–35)
Albumin: 4.6 g/dL (ref 3.6–5.1)
Alkaline phosphatase (APISO): 73 U/L (ref 35–144)
BUN: 15 mg/dL (ref 7–25)
CO2: 24 mmol/L (ref 20–32)
Calcium: 9.6 mg/dL (ref 8.6–10.3)
Chloride: 103 mmol/L (ref 98–110)
Creat: 1.03 mg/dL (ref 0.70–1.35)
Globulin: 3.1 g/dL (calc) (ref 1.9–3.7)
Glucose, Bld: 128 mg/dL — ABNORMAL HIGH (ref 65–99)
Potassium: 4.5 mmol/L (ref 3.5–5.3)
Sodium: 139 mmol/L (ref 135–146)
Total Bilirubin: 0.8 mg/dL (ref 0.2–1.2)
Total Protein: 7.7 g/dL (ref 6.1–8.1)
eGFR: 81 mL/min/{1.73_m2} (ref >=60)

## 2022-09-01 LAB — TSH: TSH: 1.08 mIU/L (ref 0.40–4.50)

## 2022-09-08 ENCOUNTER — Encounter: Payer: Self-pay | Admitting: Family Medicine

## 2022-09-08 ENCOUNTER — Ambulatory Visit (INDEPENDENT_AMBULATORY_CARE_PROVIDER_SITE_OTHER): Payer: 59 | Admitting: Family Medicine

## 2022-09-08 VITALS — BP 126/72 | HR 80 | Ht 67.0 in | Wt 206.0 lb

## 2022-09-08 DIAGNOSIS — E1169 Type 2 diabetes mellitus with other specified complication: Secondary | ICD-10-CM

## 2022-09-08 DIAGNOSIS — E119 Type 2 diabetes mellitus without complications: Secondary | ICD-10-CM | POA: Diagnosis not present

## 2022-09-08 DIAGNOSIS — E785 Hyperlipidemia, unspecified: Secondary | ICD-10-CM

## 2022-09-08 DIAGNOSIS — E039 Hypothyroidism, unspecified: Secondary | ICD-10-CM

## 2022-09-08 DIAGNOSIS — Z23 Encounter for immunization: Secondary | ICD-10-CM

## 2022-09-08 LAB — POCT UA - MICROALBUMIN
Albumin/Creatinine Ratio, Urine, POC: <30
Creatinine, POC: 200 mg/dL
Microalbumin Ur, POC: 30 mg/L

## 2022-09-08 LAB — POCT GLYCOSYLATED HEMOGLOBIN (HGB A1C): HbA1c, POC (controlled diabetic range): 6.4 % (ref 0.0–7.0)

## 2022-09-08 MED ORDER — METFORMIN HCL 1000 MG PO TABS: 1000.0000 mg | ORAL_TABLET | Freq: Every day | ORAL | 3 refills | Status: DC

## 2022-09-08 NOTE — Assessment & Plan Note (Signed)
Lab Results  Component Value Date   TSH 1.08 08/31/2022    Feels good with current dose of levothyroxine.  We will plan to continue current strength.

## 2022-09-08 NOTE — Assessment & Plan Note (Signed)
Tolerating atorvastatin well with good strength.  We will plan to continue.

## 2022-09-08 NOTE — Assessment & Plan Note (Signed)
Blood sugar remains well controlled, actually improved slightly since last visit.  We will plan to continue current medications.  Encourage dietary change with regular exercise.

## 2022-09-08 NOTE — Patient Instructions (Signed)
Continue current medications.  See me again in 6 months.  

## 2022-09-08 NOTE — Progress Notes (Signed)
Paul Reed - 66 y.o. male MRN [id number]  Date of birth: [birth year]  Subjective Chief Complaint  Patient presents with   Diabetes   Hypertension    HPI Paul Reed is a 66 year old male here today for follow-up visit.  Reports he is doing well at this time.  Continues on combination of Jardiance, glipizide and metformin for management of his diabetes.  Diabetes had improved at last visit.  He continues to work on some dietary changes and staying active.  Denies symptoms related to his diabetes including increased thirst or urination, vision changes.  He has not had any symptoms of hypoglycemia.  He continues on atorvastatin as well.  Tolerating this well.  Additionally feels good with current dose of levothyroxine.'  ROS:  A comprehensive ROS was completed and negative except as noted per HPI  Allergies  Allergen Reactions   Other Hives    Bee Stings    Past Medical History:  Diagnosis Date   Bursitis    Chronic kidney disease    stones   ELEVATED PROSTATE SPECIFIC ANTIGEN 10/23/2008   Biopsy planned with Dr. Sherryl Barters ([facility])     Hypothyroidism    Impaired fasting glucose    Obesity    OSA on CPAP    13 years    Past Surgical History:  Procedure Laterality Date   ESOPHAGUS SURGERY     Removed glass in infancy    Social History   Socioeconomic History   Marital status: Married    Spouse name: Not on file   Number of children: Not on file   Years of education: Not on file   Highest education level: Not on file  Occupational History   Not on file  Tobacco Use   Smoking status: Never   Smokeless tobacco: Never  Substance and Sexual Activity   Alcohol use: Yes    Comment: very infrequent   Drug use: No   Sexual activity: Yes    Comment: Engineer, structural, [employer], married, 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, noexercise, poor diet, 3 soda's daily  Other Topics Concern   Not on file  Social History Narrative   Not on file   Social Determinants of Health    Financial Resource Strain: Not on file  Food Insecurity: Not on file  Transportation Needs: Not on file  Physical Activity: Not on file  Stress: Not on file  Social Connections: Not on file    Family History  Problem Relation Age of Onset   Cancer Father    Thyroid disease Unknown        mother    Health Maintenance  Topic Date Due   COVID-19 Vaccine (4 - Moderna series) 01/20/2023 (Originally 03/12/2022)   Fecal DNA (Cologuard)  11/07/2022   FOOT EXAM  12/07/2022   OPHTHALMOLOGY EXAM  01/28/2023   HEMOGLOBIN A1C  03/09/2023   Diabetic kidney evaluation - GFR measurement  09/01/2023   Diabetic kidney evaluation - Urine ACR  09/09/2023   TETANUS/TDAP  09/08/2032   INFLUENZA VACCINE  Completed   Hepatitis C Screening  Completed   HIV Screening  Completed   Zoster Vaccines- Shingrix  Completed   HPV VACCINES  Aged Out   COLONOSCOPY (Pts 45-24yrs Insurance coverage will need to be confirmed)  Discontinued     ----------------------------------------------------------------------------------------------------------------------------------------------------------------------------------------------------------------- Physical Exam BP 126/72 (BP Location: Left Arm, Patient Position: Sitting, Cuff Size: Normal)   Pulse 80   Ht 5\' 7"  (1.702 m)   Wt 206 lb (93.4 kg)  SpO2 95%   BMI 32.26 kg/m   Physical Exam Constitutional:      Appearance: Normal appearance.  Eyes:     General: No scleral icterus. Cardiovascular:     Rate and Rhythm: Normal rate and regular rhythm.  Pulmonary:     Effort: Pulmonary effort is normal.     Breath sounds: Normal breath sounds.  Musculoskeletal:     Cervical back: Neck supple.  Neurological:     Mental Status: He is alert.  Psychiatric:        Mood and Affect: Mood normal.        Behavior: Behavior normal.      ------------------------------------------------------------------------------------------------------------------------------------------------------------------------------------------------------------------- Assessment and Plan  Type 2 diabetes mellitus ([place]) Blood sugar remains well controlled, actually improved slightly since last visit.  We will plan to continue current medications.  Encourage dietary change with regular exercise.  Hypothyroidism Lab Results  Component Value Date   TSH 1.08 08/31/2022    Feels good with current dose of levothyroxine.  We will plan to continue current strength.  Hyperlipidemia associated with type 2 diabetes mellitus ([place]) Tolerating atorvastatin well with good strength.  We will plan to continue.   Meds ordered this encounter  Medications   metFORMIN (GLUCOPHAGE) 1000 MG tablet    Sig: Take 1 tablet (1,000 mg total) by mouth daily.    Dispense:  90 tablet    Refill:  3    Return in about 6 months (around 03/09/2023) for T2DM.    This visit occurred during the SARS-CoV-2 public health emergency.  Safety protocols were in place, including screening questions prior to the visit, additional usage of staff PPE, and extensive cleaning of exam room while observing appropriate contact time as indicated for disinfecting solutions.

## 2022-10-20 DIAGNOSIS — N3289 Other specified disorders of bladder: Secondary | ICD-10-CM | POA: Diagnosis not present

## 2022-10-20 DIAGNOSIS — N200 Calculus of kidney: Secondary | ICD-10-CM | POA: Diagnosis not present

## 2022-10-20 DIAGNOSIS — N400 Enlarged prostate without lower urinary tract symptoms: Secondary | ICD-10-CM | POA: Diagnosis not present

## 2022-11-17 ENCOUNTER — Other Ambulatory Visit: Payer: Self-pay

## 2022-11-17 DIAGNOSIS — Z1211 Encounter for screening for malignant neoplasm of colon: Secondary | ICD-10-CM

## 2022-11-29 DIAGNOSIS — Z1211 Encounter for screening for malignant neoplasm of colon: Secondary | ICD-10-CM | POA: Diagnosis not present

## 2022-12-06 LAB — COLOGUARD: COLOGUARD: NEGATIVE

## 2022-12-30 DIAGNOSIS — E785 Hyperlipidemia, unspecified: Secondary | ICD-10-CM | POA: Diagnosis not present

## 2022-12-30 DIAGNOSIS — Z6832 Body mass index (BMI) 32.0-32.9, adult: Secondary | ICD-10-CM | POA: Diagnosis not present

## 2022-12-30 DIAGNOSIS — R69 Illness, unspecified: Secondary | ICD-10-CM | POA: Diagnosis not present

## 2022-12-30 DIAGNOSIS — I10 Essential (primary) hypertension: Secondary | ICD-10-CM | POA: Diagnosis not present

## 2022-12-30 DIAGNOSIS — Z801 Family history of malignant neoplasm of trachea, bronchus and lung: Secondary | ICD-10-CM | POA: Diagnosis not present

## 2022-12-30 DIAGNOSIS — G4733 Obstructive sleep apnea (adult) (pediatric): Secondary | ICD-10-CM | POA: Diagnosis not present

## 2022-12-30 DIAGNOSIS — E669 Obesity, unspecified: Secondary | ICD-10-CM | POA: Diagnosis not present

## 2022-12-30 DIAGNOSIS — Z8261 Family history of arthritis: Secondary | ICD-10-CM | POA: Diagnosis not present

## 2022-12-30 DIAGNOSIS — Z7984 Long term (current) use of oral hypoglycemic drugs: Secondary | ICD-10-CM | POA: Diagnosis not present

## 2022-12-30 DIAGNOSIS — E119 Type 2 diabetes mellitus without complications: Secondary | ICD-10-CM | POA: Diagnosis not present

## 2022-12-30 DIAGNOSIS — E876 Hypokalemia: Secondary | ICD-10-CM | POA: Diagnosis not present

## 2022-12-30 DIAGNOSIS — E039 Hypothyroidism, unspecified: Secondary | ICD-10-CM | POA: Diagnosis not present

## 2023-02-11 ENCOUNTER — Other Ambulatory Visit: Payer: Self-pay | Admitting: Family Medicine

## 2023-03-09 ENCOUNTER — Encounter: Payer: Self-pay | Admitting: Family Medicine

## 2023-03-09 ENCOUNTER — Ambulatory Visit (INDEPENDENT_AMBULATORY_CARE_PROVIDER_SITE_OTHER): Payer: HMO | Admitting: Family Medicine

## 2023-03-09 VITALS — BP 115/72 | HR 97 | Ht 67.0 in | Wt 212.0 lb

## 2023-03-09 DIAGNOSIS — E039 Hypothyroidism, unspecified: Secondary | ICD-10-CM | POA: Diagnosis not present

## 2023-03-09 DIAGNOSIS — E1169 Type 2 diabetes mellitus with other specified complication: Secondary | ICD-10-CM

## 2023-03-09 DIAGNOSIS — E119 Type 2 diabetes mellitus without complications: Secondary | ICD-10-CM

## 2023-03-09 DIAGNOSIS — E785 Hyperlipidemia, unspecified: Secondary | ICD-10-CM

## 2023-03-09 LAB — POCT GLYCOSYLATED HEMOGLOBIN (HGB A1C): HbA1c, POC (controlled diabetic range): 7.6 % — AB (ref 0.0–7.0)

## 2023-03-09 NOTE — Assessment & Plan Note (Signed)
Continue with current dose levothyroxine.

## 2023-03-09 NOTE — Assessment & Plan Note (Signed)
Worsening control of diabetes since last visit.  Encouraged to continue to work on dietary changes as well as increased activities.  Follow-up in 4 months.

## 2023-03-09 NOTE — Progress Notes (Signed)
Paul Reed - 67 y.o. male MRN [id number]  Date of birth: 01-Jan-1956  Subjective Chief Complaint  Patient presents with   Diabetes    HPI Paul Reed  is a 67 year old male here today for follow-up visit.  Reports he is feeling well at this time.  A1c is increased today.  He does continue on Jardiance, glipizide and metformin.  He is tolerating these well at current strength.  He admits that his activity and diet have not been very good over the winter.  He is starting to do a little more now that the weather is improving.  He would prefer to work on diet and lifestyle changes before adding additional medication.  Tolerating atorvastatin well at current strength.  She is taking levothyroxine regularly.  Feels good at current strength.  ROS:  A comprehensive ROS was completed and negative except as noted per HPI   Allergies  Allergen Reactions   Other Hives    Bee Stings    Past Medical History:  Diagnosis Date   Bursitis    Chronic kidney disease    stones   ELEVATED PROSTATE SPECIFIC ANTIGEN 10/23/2008   Biopsy planned with Dr. Pilar Jarvis ([facility])     Hypothyroidism    Impaired fasting glucose    Obesity    OSA on CPAP    13 years    Past Surgical History:  Procedure Laterality Date   ESOPHAGUS SURGERY     Removed glass in infancy    Social History   Socioeconomic History   Marital status: Married    Spouse name: Not on file   Number of children: Not on file   Years of education: Not on file   Highest education level: Not on file  Occupational History   Not on file  Tobacco Use   Smoking status: Never   Smokeless tobacco: Never  Substance and Sexual Activity   Alcohol use: Yes    Comment: very infrequent   Drug use: No   Sexual activity: Yes    Comment: Building services engineer, [employer], married, 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, noexercise, poor diet, 3 soda's daily  Other Topics Concern   Not on file  Social History Narrative   Not on file    Social Determinants of Health   Financial Resource Strain: Not on file  Food Insecurity: Not on file  Transportation Needs: Not on file  Physical Activity: Not on file  Stress: Not on file  Social Connections: Not on file    Family History  Problem Relation Age of Onset   Cancer Father    Thyroid disease Unknown        mother    Health Maintenance  Topic Date Due   Pneumonia Vaccine 40+ Years old (2 of 2 - PCV) 02/06/2023   OPHTHALMOLOGY EXAM  09/09/2023 (Originally 01/28/2023)   Medicare Annual Wellness (AWV)  09/09/2023 (Originally 11/03/1956)   COVID-19 Vaccine (5 - 2023-24 season) 03/24/2024 (Originally 08/20/2022)   Diabetic kidney evaluation - eGFR measurement  09/01/2023   Diabetic kidney evaluation - Urine ACR  09/09/2023   HEMOGLOBIN A1C  09/09/2023   FOOT EXAM  03/08/2024   Fecal DNA (Cologuard)  11/29/2025   DTaP/Tdap/Td (3 - Td or Tdap) 09/08/2032   INFLUENZA VACCINE  Completed   Hepatitis C Screening  Completed   HIV Screening  Completed   Zoster Vaccines- Shingrix  Completed   HPV VACCINES  Aged Out   COLONOSCOPY (Pts 45-90yrs Insurance coverage will need to be  confirmed)  Discontinued     ----------------------------------------------------------------------------------------------------------------------------------------------------------------------------------------------------------------- Physical Exam BP 115/72 (BP Location: Left Arm, Patient Position: Sitting, Cuff Size: Normal)   Pulse 97   Ht 5\' 7"  (1.702 m)   Wt 212 lb (96.2 kg)   SpO2 95%   BMI 33.20 kg/m   Physical Exam Constitutional:      Appearance: Normal appearance.  HENT:     Head: Normocephalic and atraumatic.  Eyes:     General: No scleral icterus. Cardiovascular:     Rate and Rhythm: Normal rate and regular rhythm.  Neurological:     Mental Status: He is alert.  Psychiatric:        Mood and Affect: Mood normal.        Behavior: Behavior normal.      ------------------------------------------------------------------------------------------------------------------------------------------------------------------------------------------------------------------- Assessment and Plan  Type 2 diabetes mellitus ([place]) Worsening control of diabetes since last visit.  Encouraged to continue to work on dietary changes as well as increased activities.  Follow-up in 4 months.  Hyperlipidemia associated with type 2 diabetes mellitus ([place]) Tolerating atorvastatin well at current strength.  Recommend continuation.  Hypothyroidism Continue with current dose levothyroxine.   No orders of the defined types were placed in this encounter.   Return in about 4 months (around 07/09/2023) for T2DM.    This visit occurred during the SARS-CoV-2 public health emergency.  Safety protocols were in place, including screening questions prior to the visit, additional usage of staff PPE, and extensive cleaning of exam room while observing appropriate contact time as indicated for disinfecting solutions.

## 2023-03-09 NOTE — Assessment & Plan Note (Signed)
Tolerating atorvastatin well at current strength.  Recommend continuation. 

## 2023-04-25 ENCOUNTER — Other Ambulatory Visit: Payer: Self-pay | Admitting: Family Medicine

## 2023-04-25 DIAGNOSIS — E119 Type 2 diabetes mellitus without complications: Secondary | ICD-10-CM

## 2023-04-25 DIAGNOSIS — E1169 Type 2 diabetes mellitus with other specified complication: Secondary | ICD-10-CM

## 2023-05-09 ENCOUNTER — Other Ambulatory Visit: Payer: Self-pay | Admitting: Family Medicine

## 2023-05-20 ENCOUNTER — Other Ambulatory Visit: Payer: Self-pay | Admitting: Family Medicine

## 2023-05-20 DIAGNOSIS — E119 Type 2 diabetes mellitus without complications: Secondary | ICD-10-CM

## 2023-06-29 ENCOUNTER — Other Ambulatory Visit: Payer: Self-pay | Admitting: Family Medicine

## 2023-06-29 DIAGNOSIS — E039 Hypothyroidism, unspecified: Secondary | ICD-10-CM

## 2023-07-01 ENCOUNTER — Other Ambulatory Visit: Payer: Self-pay | Admitting: Family Medicine

## 2023-07-11 ENCOUNTER — Ambulatory Visit: Payer: HMO | Admitting: Family Medicine

## 2023-07-14 ENCOUNTER — Encounter: Payer: Self-pay | Admitting: Family Medicine

## 2023-07-14 ENCOUNTER — Ambulatory Visit (INDEPENDENT_AMBULATORY_CARE_PROVIDER_SITE_OTHER): Payer: HMO | Admitting: Family Medicine

## 2023-07-14 VITALS — BP 115/76 | HR 90 | Ht 67.0 in | Wt 207.0 lb

## 2023-07-14 DIAGNOSIS — E1169 Type 2 diabetes mellitus with other specified complication: Secondary | ICD-10-CM | POA: Diagnosis not present

## 2023-07-14 DIAGNOSIS — N200 Calculus of kidney: Secondary | ICD-10-CM | POA: Diagnosis not present

## 2023-07-14 DIAGNOSIS — E039 Hypothyroidism, unspecified: Secondary | ICD-10-CM | POA: Diagnosis not present

## 2023-07-14 DIAGNOSIS — E785 Hyperlipidemia, unspecified: Secondary | ICD-10-CM

## 2023-07-14 DIAGNOSIS — E559 Vitamin D deficiency, unspecified: Secondary | ICD-10-CM

## 2023-07-14 DIAGNOSIS — E119 Type 2 diabetes mellitus without complications: Secondary | ICD-10-CM | POA: Diagnosis not present

## 2023-07-14 DIAGNOSIS — E781 Pure hyperglyceridemia: Secondary | ICD-10-CM

## 2023-07-14 DIAGNOSIS — Z23 Encounter for immunization: Secondary | ICD-10-CM | POA: Diagnosis not present

## 2023-07-14 DIAGNOSIS — Z0000 Encounter for general adult medical examination without abnormal findings: Secondary | ICD-10-CM

## 2023-07-14 DIAGNOSIS — R972 Elevated prostate specific antigen [PSA]: Secondary | ICD-10-CM

## 2023-07-14 LAB — POCT GLYCOSYLATED HEMOGLOBIN (HGB A1C): HbA1c, POC (controlled diabetic range): 7.5 % — AB (ref 0.0–7.0)

## 2023-07-14 NOTE — Assessment & Plan Note (Signed)
Seen by urology, on potassium citrate and hydrochlorothiazide.

## 2023-07-14 NOTE — Progress Notes (Signed)
Paul Reed - 67 y.o. male MRN [id number]  Date of birth: 1956/05/09  Subjective Chief Complaint  Patient presents with   Diabetes    HPI Paul Reed is a 67 y.o.  male here today for follow up.   He has seen urology since his last visit with me.  Hydrochlorothiazide and potassium citrate added for recurrent renal stones.  Overall he is doing well with this. BP is stable with addition of hydrochlorothiazide.   Continues on metformin, jardiance, and glipizide for management of T2DM.  He reports that he is tolerating medications well.  A1c is a little better compared to last time, at 7.5% today. He admits that diet could be better and he is confident that he can improve his blood sugars with these changes.     On stable dose of levothyroxine and feels pretty good with this.    ROS:  A comprehensive ROS was completed and negative except as noted per HPI  Allergies  Allergen Reactions   Bee Venom Hives    Bee Stings   Other Hives    Bee Stings    Past Medical History:  Diagnosis Date   Bursitis    Chronic kidney disease    stones   ELEVATED PROSTATE SPECIFIC ANTIGEN 10/23/2008   Biopsy planned with Dr. Sherryl Barters ([facility])     Hypothyroidism    Impaired fasting glucose    Obesity    OSA on CPAP    13 years    Past Surgical History:  Procedure Laterality Date   ESOPHAGUS SURGERY     Removed glass in infancy    Social History   Socioeconomic History   Marital status: Married    Spouse name: Not on file   Number of children: Not on file   Years of education: Not on file   Highest education level: Not on file  Occupational History   Not on file  Tobacco Use   Smoking status: Never   Smokeless tobacco: Never  Substance and Sexual Activity   Alcohol use: Yes    Comment: very infrequent   Drug use: No   Sexual activity: Yes    Comment: Engineer, structural, [employer], married, 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, noexercise, poor diet, 3 soda's daily  Other Topics  Concern   Not on file  Social History Narrative   Not on file   Social Determinants of Health   Financial Resource Strain: Not on file  Food Insecurity: No Food Insecurity (03/28/2022)   [facility]   Hunger Vital Sign    Worried About Running Out of Food in the Last Year: Never true    Ran Out of Food in the Last Year: Never true  Transportation Needs: Not on file  Physical Activity: Not on file  Stress: Not on file  Social Connections: Not on file    Family History  Problem Relation Age of Onset   Cancer Father    Thyroid disease Unknown        mother    Health Maintenance  Topic Date Due   OPHTHALMOLOGY EXAM  09/09/2023 (Originally 01/28/2023)   Medicare Annual Wellness (AWV)  09/09/2023 (Originally 1955/12/31)   COVID-19 Vaccine (5 - 2023-24 season) 03/24/2024 (Originally 08/20/2022)   Pneumonia Vaccine 16+ Years old (2 of 2 - PCV) 07/13/2024 (Originally 02/06/2023)   INFLUENZA VACCINE  07/21/2023   Diabetic kidney evaluation - eGFR measurement  09/01/2023  Diabetic kidney evaluation - Urine ACR  09/09/2023   HEMOGLOBIN A1C  09/09/2023   FOOT EXAM  03/08/2024   Fecal DNA (Cologuard)  11/29/2025   DTaP/Tdap/Td (3 - Td or Tdap) 09/08/2032   Hepatitis C Screening  Completed   HIV Screening  Completed   Zoster Vaccines- Shingrix  Completed   HPV VACCINES  Aged Out   Colonoscopy  Discontinued     ----------------------------------------------------------------------------------------------------------------------------------------------------------------------------------------------------------------- Physical Exam BP 115/76 (BP Location: Left Arm, Patient Position: Sitting, Cuff Size: Normal)   Pulse 90   Ht 5\' 7"  (1.702 m)   Wt 207 lb (93.9 kg)   SpO2 95%   BMI 32.42 kg/m   Physical Exam Constitutional:      Appearance: Normal appearance.  HENT:     Head: Normocephalic  and atraumatic.  Eyes:     General: No scleral icterus. Cardiovascular:     Rate and Rhythm: Normal rate and regular rhythm.  Pulmonary:     Effort: Pulmonary effort is normal.     Breath sounds: Normal breath sounds.  Musculoskeletal:     Cervical back: Neck supple.  Neurological:     General: No focal deficit present.     Mental Status: He is alert.  Psychiatric:        Mood and Affect: Mood normal.        Behavior: Behavior normal.     ------------------------------------------------------------------------------------------------------------------------------------------------------------------------------------------------------------------- Assessment and Plan  Type 2 diabetes mellitus (HCC) Encouraged working on dietary changes to help with lowering blood sugars.  Continue current medications  NEPHROLITHIASIS Seen by urology, on potassium citrate and hydrochlorothiazide.    Hypothyroidism Doing well with current strength of levothyroxine.    Hyperlipidemia associated with type 2 diabetes mellitus (HCC) Continue atorvastatin at current strength.    No orders of the defined types were placed in this encounter.   Return in about 3 months (around 10/14/2023) for Annual exam.   Lab orders entered for annual exam in 3 months.    This visit occurred during the SARS-CoV-2 public health emergency.  Safety protocols were in place, including screening questions prior to the visit, additional usage of staff PPE, and extensive cleaning of exam room while observing appropriate contact time as indicated for disinfecting solutions.

## 2023-07-14 NOTE — Assessment & Plan Note (Signed)
Encouraged working on dietary changes to help with lowering blood sugars.  Continue current medications

## 2023-07-14 NOTE — Assessment & Plan Note (Signed)
Doing well with current strength of levothyroxine.

## 2023-07-14 NOTE — Patient Instructions (Signed)

## 2023-07-14 NOTE — Assessment & Plan Note (Signed)
Continue atorvastatin at current strength.  

## 2023-09-17 ENCOUNTER — Other Ambulatory Visit: Payer: Self-pay | Admitting: Family Medicine

## 2023-09-17 DIAGNOSIS — E1169 Type 2 diabetes mellitus with other specified complication: Secondary | ICD-10-CM

## 2023-09-17 DIAGNOSIS — E119 Type 2 diabetes mellitus without complications: Secondary | ICD-10-CM

## 2023-09-28 ENCOUNTER — Other Ambulatory Visit: Payer: Self-pay | Admitting: Family Medicine

## 2023-10-17 ENCOUNTER — Ambulatory Visit (INDEPENDENT_AMBULATORY_CARE_PROVIDER_SITE_OTHER): Payer: HMO | Admitting: Family Medicine

## 2023-10-17 ENCOUNTER — Encounter: Payer: Self-pay | Admitting: Family Medicine

## 2023-10-17 VITALS — BP 113/71 | HR 86 | Ht 67.0 in | Wt 212.0 lb

## 2023-10-17 DIAGNOSIS — E119 Type 2 diabetes mellitus without complications: Secondary | ICD-10-CM

## 2023-10-17 DIAGNOSIS — Z23 Encounter for immunization: Secondary | ICD-10-CM

## 2023-10-17 DIAGNOSIS — Z0000 Encounter for general adult medical examination without abnormal findings: Secondary | ICD-10-CM

## 2023-10-17 DIAGNOSIS — Z7984 Long term (current) use of oral hypoglycemic drugs: Secondary | ICD-10-CM | POA: Diagnosis not present

## 2023-10-17 LAB — POCT GLYCOSYLATED HEMOGLOBIN (HGB A1C): HbA1c, POC (controlled diabetic range): 7.7 % — AB (ref 0.0–7.0)

## 2023-10-17 MED ORDER — TIRZEPATIDE 7.5 MG/0.5ML ~~LOC~~ SOAJ: 7.5000 mg | SUBCUTANEOUS | 0 refills | Status: DC

## 2023-10-17 MED ORDER — TIRZEPATIDE 2.5 MG/0.5ML ~~LOC~~ SOAJ: 2.5000 mg | SUBCUTANEOUS | 0 refills | Status: DC

## 2023-10-17 MED ORDER — TIRZEPATIDE 5 MG/0.5ML ~~LOC~~ SOAJ: 5.0000 mg | SUBCUTANEOUS | 0 refills | Status: DC

## 2023-10-17 NOTE — Assessment & Plan Note (Signed)
Well adult Orders Placed This Encounter  Procedures   Flu Vaccine Trivalent High Dose (Fluad)   POCT HgB A1C  Labs ordered previously, he will have these completed.  Immunizations:  Flu vaccine given.  Anticipatory guidance/Risk factor reduction:  Recommendations per AVS.

## 2023-10-17 NOTE — Assessment & Plan Note (Signed)
A1c continues to slowly trend up.  Adding mounjaro with titration to 7.5mg  over the next 3 months.  Continue all other medications.  See me again in 3 months.
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
## 2023-10-17 NOTE — Patient Instructions (Addendum)
Start mounjaro once approved.  Try Paul Reed    Preventive Care 65 Years and Older, Male Preventive care refers to lifestyle choices and visits with your health care provider that can promote health and wellness. Preventive care visits are also called wellness exams. What can I expect for my preventive care visit? Counseling During your preventive care visit, your health care provider may ask about your: Medical history, including: Past medical problems. Family medical history. History of falls. Current health, including: Emotional well-being. Home life and relationship well-being. Sexual activity. Memory and ability to understand (cognition). Lifestyle, including: Alcohol, nicotine or tobacco, and drug use. Access to firearms. Diet, exercise, and sleep habits. Work and work Astronomer. Sunscreen use. Safety issues such as seatbelt and bike helmet use. Physical exam Your health care provider will check your: Height and weight. These may be used to calculate your BMI (body mass index). BMI is a measurement that tells if you are at a healthy weight. Waist circumference. This measures the distance around your waistline. This measurement also tells if you are at a healthy weight and may help predict your risk of certain diseases, such as type 2 diabetes and high blood pressure. Heart rate and blood pressure. Body temperature. Skin for abnormal spots. What immunizations do I need?  Vaccines are usually given at various ages, according to a schedule. Your health care provider will recommend vaccines for you based on your age, medical history, and lifestyle or other factors, such as travel or where you work. What tests do I need? Screening Your health care provider may recommend screening tests for certain conditions. This may include: Lipid and cholesterol levels. Diabetes screening. This is done by checking your blood sugar (glucose) after you have not eaten for a while  (fasting). Hepatitis C test. Hepatitis B test. HIV (human immunodeficiency virus) test. STI (sexually transmitted infection) testing, if you are at risk. Lung cancer screening. Colorectal cancer screening. Prostate cancer screening. Abdominal aortic aneurysm (AAA) screening. You may need this if you are a current or former smoker. Talk with your health care provider about your test results, treatment options, and if necessary, the need for more tests. Follow these instructions at home: Eating and drinking  Eat a diet that includes fresh fruits and vegetables, whole grains, lean protein, and low-fat dairy products. Limit your intake of foods with high amounts of sugar, saturated fats, and salt. Take vitamin and mineral supplements as recommended by your health care provider. Do not drink alcohol if your health care provider tells you not to drink. If you drink alcohol: Limit how much you have to 0-2 drinks a day. Know how much alcohol is in your drink. In the [place] one drink equals one 12 oz bottle of beer (355 mL), one 5 oz glass of wine (148 mL), or one 1 oz glass of hard liquor (44 mL). Lifestyle Brush your teeth every morning and night with fluoride toothpaste. Floss one time each day. Exercise for at least 30 minutes 5 or more days each week. Do not use any products that contain nicotine or tobacco. These products include cigarettes, chewing tobacco, and vaping devices, such as e-cigarettes. If you need help quitting, ask your health care provider. Do not use drugs. If you are sexually active, practice safe sex. Use a condom or other form of protection to prevent STIs. Take aspirin only as told by your health care provider. Make sure that you understand how much to take and what form to take. Work with your  health care provider to find out whether it is safe and beneficial for you to take aspirin daily. Ask your health care provider if you need to take a cholesterol-lowering medicine  (statin). Find healthy ways to manage stress, such as: Meditation, yoga, or listening to music. Journaling. Talking to a trusted person. Spending time with friends and family. Safety Always wear your seat belt while driving or riding in a vehicle. Do not drive: If you have been drinking alcohol. Do not ride with someone who has been drinking. When you are tired or distracted. While texting. If you have been using any mind-altering substances or drugs. Wear a helmet and other protective equipment during sports activities. If you have firearms in your house, make sure you follow all gun safety procedures. Minimize exposure to UV radiation to reduce your risk of skin cancer. What's next? Visit your health care provider once a year for an annual wellness visit. Ask your health care provider how often you should have your eyes and teeth checked. Stay up to date on all vaccines. This information is not intended to replace advice given to you by your health care provider. Make sure you discuss any questions you have with your health care provider. Document Revised: 06/03/2021 Document Reviewed: 06/03/2021 Elsevier Patient Education  2024 [employer].

## 2023-10-20 LAB — CBC WITH DIFFERENTIAL/PLATELET
Basophils Absolute: 0 10*3/uL (ref 0.0–0.2)
Basos: 0 %
EOS (ABSOLUTE): 0.1 10*3/uL (ref 0.0–0.4)
Eos: 2 %
Hematocrit: 51 % (ref 37.5–51.0)
Hemoglobin: 16.6 g/dL (ref 13.0–17.7)
Immature Grans (Abs): 0 10*3/uL (ref 0.0–0.1)
Immature Granulocytes: 1 %
Lymphocytes Absolute: 2.8 10*3/uL (ref 0.7–3.1)
Lymphs: 32 %
MCH: 31.3 pg (ref 26.6–33.0)
MCHC: 32.5 g/dL (ref 31.5–35.7)
MCV: 96 fL (ref 79–97)
Monocytes Absolute: 0.9 10*3/uL (ref 0.1–0.9)
Monocytes: 10 %
Neutrophils Absolute: 4.8 10*3/uL (ref 1.4–7.0)
Neutrophils: 55 %
Platelets: 296 10*3/uL (ref 150–450)
RBC: 5.31 x10E6/uL (ref 4.14–5.80)
RDW: 12.5 % (ref 11.6–15.4)
WBC: 8.6 10*3/uL (ref 3.4–10.8)

## 2023-10-20 LAB — MICROALBUMIN / CREATININE URINE RATIO
Creatinine, Urine: 71.1 mg/dL
Microalb/Creat Ratio: 15 mg/g{creat} (ref 0–29)
Microalbumin, Urine: 10.8 ug/mL

## 2023-10-20 LAB — LIPID PANEL WITH LDL/HDL RATIO
Cholesterol, Total: 115 mg/dL (ref 100–199)
HDL: 51 mg/dL (ref >39)
LDL Chol Calc (NIH): 36 mg/dL (ref 0–99)
LDL/HDL Ratio: 0.7 {ratio} (ref 0.0–3.6)
Triglycerides: 169 mg/dL — ABNORMAL HIGH (ref 0–149)
VLDL Cholesterol Cal: 28 mg/dL (ref 5–40)

## 2023-10-20 LAB — CMP14+EGFR
ALT: 42 [IU]/L (ref 0–44)
AST: 42 [IU]/L — ABNORMAL HIGH (ref 0–40)
Albumin: 4.5 g/dL (ref 3.9–4.9)
Alkaline Phosphatase: 82 [IU]/L (ref 44–121)
BUN/Creatinine Ratio: 11 (ref 10–24)
BUN: 11 mg/dL (ref 8–27)
Bilirubin Total: 0.7 mg/dL (ref 0.0–1.2)
CO2: 21 mmol/L (ref 20–29)
Calcium: 9.8 mg/dL (ref 8.6–10.2)
Chloride: 101 mmol/L (ref 96–106)
Creatinine, Ser: 0.99 mg/dL (ref 0.76–1.27)
Globulin, Total: 3.2 g/dL (ref 1.5–4.5)
Glucose: 107 mg/dL — ABNORMAL HIGH (ref 70–99)
Potassium: 3.8 mmol/L (ref 3.5–5.2)
Sodium: 139 mmol/L (ref 134–144)
Total Protein: 7.7 g/dL (ref 6.0–8.5)
eGFR: 85 mL/min/{1.73_m2} (ref >59)

## 2023-10-20 LAB — PSA: Prostate Specific Ag, Serum: 9.1 ng/mL — ABNORMAL HIGH (ref 0.0–4.0)

## 2023-10-20 LAB — VITAMIN D 25 HYDROXY (VIT D DEFICIENCY, FRACTURES): Vit D, 25-Hydroxy: 26.7 ng/mL — ABNORMAL LOW (ref 30.0–100.0)

## 2023-10-20 LAB — TSH: TSH: 0.76 u[IU]/mL (ref 0.450–4.500)

## 2023-11-04 ENCOUNTER — Other Ambulatory Visit: Payer: Self-pay | Admitting: Family Medicine

## 2023-11-04 DIAGNOSIS — R972 Elevated prostate specific antigen [PSA]: Secondary | ICD-10-CM

## 2023-11-13 ENCOUNTER — Other Ambulatory Visit: Payer: Self-pay | Admitting: Family Medicine

## 2023-11-13 DIAGNOSIS — E119 Type 2 diabetes mellitus without complications: Secondary | ICD-10-CM

## 2023-12-03 ENCOUNTER — Other Ambulatory Visit: Payer: Self-pay | Admitting: Family Medicine

## 2023-12-03 DIAGNOSIS — E119 Type 2 diabetes mellitus without complications: Secondary | ICD-10-CM

## 2023-12-21 ENCOUNTER — Other Ambulatory Visit: Payer: Self-pay | Admitting: Family Medicine

## 2023-12-21 DIAGNOSIS — E039 Hypothyroidism, unspecified: Secondary | ICD-10-CM

## 2023-12-22 NOTE — Telephone Encounter (Signed)
 Pls contact the pt to complete labs. Recheck due. Orders placed. Thanks

## 2023-12-22 NOTE — Telephone Encounter (Signed)
 I called pt and left voicemail letting him know Dr Ashley Royalty has ordered blood work to be done here in the office and he can come by and have that done in the office

## 2023-12-27 LAB — PSA, TOTAL AND FREE
PSA, Free Pct: 23 %
PSA, Free: 1.75 ng/mL
Prostate Specific Ag, Serum: 7.6 ng/mL — ABNORMAL HIGH (ref 0.0–4.0)

## 2024-01-17 ENCOUNTER — Encounter: Payer: Self-pay | Admitting: Family Medicine

## 2024-01-17 ENCOUNTER — Ambulatory Visit (INDEPENDENT_AMBULATORY_CARE_PROVIDER_SITE_OTHER): Payer: HMO | Admitting: Family Medicine

## 2024-01-17 VITALS — BP 119/72 | HR 67 | Ht 67.0 in | Wt 215.0 lb

## 2024-01-17 DIAGNOSIS — E1169 Type 2 diabetes mellitus with other specified complication: Secondary | ICD-10-CM

## 2024-01-17 DIAGNOSIS — Z7984 Long term (current) use of oral hypoglycemic drugs: Secondary | ICD-10-CM

## 2024-01-17 DIAGNOSIS — R972 Elevated prostate specific antigen [PSA]: Secondary | ICD-10-CM

## 2024-01-17 DIAGNOSIS — E119 Type 2 diabetes mellitus without complications: Secondary | ICD-10-CM

## 2024-01-17 DIAGNOSIS — Z23 Encounter for immunization: Secondary | ICD-10-CM | POA: Diagnosis not present

## 2024-01-17 DIAGNOSIS — E785 Hyperlipidemia, unspecified: Secondary | ICD-10-CM | POA: Diagnosis not present

## 2024-01-17 LAB — POCT GLYCOSYLATED HEMOGLOBIN (HGB A1C): HbA1c, POC (controlled diabetic range): 8.3 % — AB (ref 0.0–7.0)

## 2024-01-17 MED ORDER — TIRZEPATIDE 5 MG/0.5ML ~~LOC~~ SOAJ: 5.0000 mg | SUBCUTANEOUS | 0 refills | Status: DC

## 2024-01-17 MED ORDER — TIRZEPATIDE 2.5 MG/0.5ML ~~LOC~~ SOAJ: 2.5000 mg | SUBCUTANEOUS | 0 refills | Status: DC

## 2024-01-17 MED ORDER — TIRZEPATIDE 7.5 MG/0.5ML ~~LOC~~ SOAJ: 7.5000 mg | SUBCUTANEOUS | 0 refills | Status: DC

## 2024-01-17 NOTE — Assessment & Plan Note (Signed)
Continue atorvastatin at current strength.

## 2024-01-17 NOTE — Progress Notes (Signed)
Paul Reed - 68 y.o. male MRN [id number]  Date of birth: 1956/09/12  Subjective Chief Complaint  Patient presents with   Diabetes    HPI Paul Reed is a  68 y.o. male here today for follow up visit.   He reports that he is doing pretty well.  PA was not completed for [facility] previously so he has remained on his previous medications.  No issues with tolerance.  Denies symptoms of hypoglycemia.  His A1c is increased today at 8.3%.  He has not been as active recently due to the cold weather.  Tolerating atorvastatin for associated HLD.  BP remains well controlled with hydrochlorothiazide.  No side effects at this time.  Denies chest pain, shortness of breath, palpitations, headache or vision changes.    ROS:  A comprehensive ROS was completed and negative except as noted per HPI  Allergies  Allergen Reactions   Bee Venom Hives    Bee Stings   Other Hives    Bee Stings    Past Medical History:  Diagnosis Date   Bursitis    Chronic kidney disease    stones   ELEVATED PROSTATE SPECIFIC ANTIGEN 10/23/2008   Biopsy planned with Dr. Sherryl Barters ([facility])     Hypothyroidism    Impaired fasting glucose    Obesity    OSA on CPAP    13 years    Past Surgical History:  Procedure Laterality Date   ESOPHAGUS SURGERY     Removed glass in infancy    Social History   Socioeconomic History   Marital status: Married    Spouse name: Not on file   Number of children: Not on file   Years of education: Not on file   Highest education level: Not on file  Occupational History   Not on file  Tobacco Use   Smoking status: Never   Smokeless tobacco: Never  Substance and Sexual Activity   Alcohol use: Yes    Comment: very infrequent   Drug use: No   Sexual activity: Yes    Comment: Engineer, structural, [employer], married, 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   Hunger Vital Sign    Worried About Running Out of Food in the Last Year: Never true    Ran Out of Food in the Last Year: Never true  Transportation Needs: Not on file  Physical Activity: Not on file  Stress: Not on file  Social Connections: Not on file    Family History  Problem Relation Age of Onset   Cancer Father    Thyroid disease Unknown        mother    Health Maintenance  Topic Date Due   Medicare Annual Wellness (AWV)  Never done   OPHTHALMOLOGY EXAM  01/28/2023   HEMOGLOBIN A1C  07/16/2024   Diabetic kidney evaluation - eGFR measurement  10/18/2024   Diabetic kidney evaluation - Urine ACR  10/18/2024   FOOT EXAM  01/16/2025   Fecal DNA (Cologuard)  11/29/2025   DTaP/Tdap/Td (3 - Td or Tdap) 09/08/2032   Pneumonia Vaccine 65+ Years  old  Completed   INFLUENZA VACCINE  Completed   COVID-19 Vaccine  Completed   Hepatitis C Screening  Completed   HIV Screening  Completed   Zoster Vaccines- Shingrix  Completed   HPV VACCINES  Aged Out   Colonoscopy  Discontinued     ----------------------------------------------------------------------------------------------------------------------------------------------------------------------------------------------------------------- Physical Exam BP 119/72 (BP Location: Left Arm, Patient Position: Sitting, Cuff Size: Normal)   Pulse 67   Ht 5\' 7"  (1.702 m)   Wt 215 lb (97.5 kg)   SpO2 98%   BMI 33.67 kg/m   Physical Exam Constitutional:      Appearance: Normal appearance.  HENT:     Head: Normocephalic and atraumatic.  Cardiovascular:     Rate and Rhythm: Normal rate and regular rhythm.  Pulmonary:     Effort: Pulmonary effort is normal.      Breath sounds: Normal breath sounds.  Neurological:     General: No focal deficit present.     Mental Status: He is alert.  Psychiatric:        Mood and Affect: Mood normal.        Behavior: Behavior normal.     ------------------------------------------------------------------------------------------------------------------------------------------------------------------------------------------------------------------- Assessment and Plan  Hyperlipidemia associated with type 2 diabetes mellitus (HCC) Continue atorvastatin at current strength.   Elevated PSA Followed by urology.  Stable at this time.   Type 2 diabetes mellitus (HCC) A1c continues to slowly trend up.  Adding mounjaro with titration to 7.5mg  over the next 3 months.  Continue all other medications.  See me again in 4 months.    Meds ordered this encounter  Medications   tirzepatide (MOUNJARO) 2.5 MG/0.5ML Pen    Sig: Inject 2.5 mg into the skin once a week.    Dispense:  2 mL    Refill:  0   tirzepatide (MOUNJARO) 5 MG/0.5ML Pen    Sig: Inject 5 mg into the skin once a week.    Dispense:  2 mL    Refill:  0   tirzepatide (MOUNJARO) 7.5 MG/0.5ML Pen    Sig: Inject 7.5 mg into the skin once a week.    Dispense:  2 mL    Refill:  0    Return in about 4 months (around 05/16/2024) for Type 2 Diabetes.    This visit occurred during the SARS-CoV-2 public health emergency.  Safety protocols were in place, including screening questions prior to the visit, additional usage of staff PPE, and extensive cleaning of exam room while observing appropriate contact time as indicated for disinfecting solutions.

## 2024-01-17 NOTE — Assessment & Plan Note (Signed)
Followed by urology.  Stable at this time.

## 2024-01-17 NOTE — Assessment & Plan Note (Signed)
A1c continues to slowly trend up.  Adding mounjaro with titration to 7.5mg  over the next 3 months.  Continue all other medications.  See me again in 4 months.

## 2024-02-15 LAB — HM DIABETES EYE EXAM

## 2024-02-21 ENCOUNTER — Ambulatory Visit (INDEPENDENT_AMBULATORY_CARE_PROVIDER_SITE_OTHER): Payer: HMO

## 2024-02-21 VITALS — BP 124/76 | HR 100 | Ht 67.0 in | Wt 213.0 lb

## 2024-02-21 DIAGNOSIS — Z0000 Encounter for general adult medical examination without abnormal findings: Secondary | ICD-10-CM | POA: Diagnosis not present

## 2024-02-21 NOTE — Patient Instructions (Signed)
  Mr. Paul Reed , Thank you for taking time to come for your Medicare Wellness Visit. I appreciate your ongoing commitment to your health goals. Please review the following plan we discussed and let me know if I can assist you in the future.   These are the goals we discussed:  Goals      DIET - EAT MORE FRUITS AND VEGETABLES     He would like to grow vegetables.        This is a list of the screening recommended for you and due dates:  Health Maintenance  Topic Date Due   Hemoglobin A1C  07/16/2024   Yearly kidney function blood test for diabetes  10/18/2024   Yearly kidney health urinalysis for diabetes  10/18/2024   Complete foot exam   01/16/2025   Eye exam for diabetics  02/14/2025   Medicare Annual Wellness Visit  02/20/2025   Cologuard (Stool DNA test)  11/29/2025   DTaP/Tdap/Td vaccine (3 - Td or Tdap) 09/08/2032   Pneumonia Vaccine  Completed   Flu Shot  Completed   COVID-19 Vaccine  Completed   Hepatitis C Screening  Completed   Zoster (Shingles) Vaccine  Completed   HPV Vaccine  Aged Out   Colon Cancer Screening  Discontinued

## 2024-02-21 NOTE — Progress Notes (Signed)
 Subjective:   Paul Reed is a 68 y.o. male who presents for Medicare Annual/Subsequent preventive examination.  Visit Complete: In person  Patient Medicare AWV questionnaire was completed by the patient on n/a; I have confirmed that all information answered by patient is correct and no changes since this date.  Cardiac Risk Factors include: advanced age (>75men, >39 women);male gender;obesity (BMI >30kg/m2);diabetes mellitus     Objective:    Today's Vitals   02/21/24 1542  BP: 124/76  Pulse: 100  SpO2: 96%  Weight: 213 lb (96.6 kg)  Height: 5\' 7"  (1.702 m)   Body mass index is 33.36 kg/m.     02/21/2024    3:57 PM 08/13/2021    9:44 AM  Advanced Directives  Does Patient Have a Medical Advance Directive? No No  Would patient like information on creating a medical advance directive? Yes (MAU/Ambulatory/Procedural Areas - Information given) Yes (MAU/Ambulatory/Procedural Areas - Information given)    Current Medications (verified) Outpatient Encounter Medications as of 02/21/2024  Medication Sig   atorvastatin (LIPITOR) 40 MG tablet TAKE 1 TABLET BY MOUTH EVERY DAY   blood glucose meter kit and supplies KIT Dispense based on patient and insurance preference. Use up to four times daily as directed. Please include lancets, test strips, control solution.   glipiZIDE (GLUCOTROL) 5 MG tablet TAKE 1 TABLET BY MOUTH EVERY DAY BEFORE BREAKFAST   glucose blood test strip Use up to 4 times per day as directed with glucometer. Disp: 100. Refill x99   hydrochlorothiazide (HYDRODIURIL) 25 MG tablet Take 25 mg by mouth 2 (two) times daily.   JARDIANCE 25 MG TABS tablet TAKE 1 TABLET (25 MG TOTAL) BY MOUTH DAILY.   levothyroxine (SYNTHROID) 125 MCG tablet TAKE 1 TABLET BY MOUTH EVERY DAY BEFORE BREAKFAST   metFORMIN (GLUCOPHAGE) 1000 MG tablet TAKE 1 TABLET BY MOUTH EVERY DAY   POTASSIUM CITRATE PO Take by mouth.   tamsulosin (FLOMAX) 0.4 MG CAPS capsule Take 0.4 mg by mouth daily.    tirzepatide [facility]) 2.5 MG/0.5ML Pen Inject 2.5 mg into the skin once a week.   tirzepatide [facility]) 5 MG/0.5ML Pen Inject 5 mg into the skin once a week.   tirzepatide (MOUNJARO) 7.5 MG/0.5ML Pen Inject 7.5 mg into the skin once a week.   No facility-administered encounter medications on file as of 02/21/2024.    Allergies (verified) Bee venom and Other   History: Past Medical History:  Diagnosis Date   Bursitis    Chronic kidney disease    stones   ELEVATED PROSTATE SPECIFIC ANTIGEN 10/23/2008   Biopsy planned with Dr. Sherryl Barters ([facility])     Hypothyroidism    Impaired fasting glucose    Obesity    OSA on CPAP    13 years   Past Surgical History:  Procedure Laterality Date   ESOPHAGUS SURGERY     Removed glass in infancy   Family History  Problem Relation Age of Onset   Cancer Father    Thyroid disease Other        mother   Social History   Socioeconomic History   Marital status: Married    Spouse name: Not on file   Number of children: Not on file   Years of education: Not on file   Highest education level: Not on file  Occupational History   Not on file  Tobacco Use   Smoking status: Never   Smokeless tobacco: Never  Substance and Sexual Activity   Alcohol use: Yes  Comment: very infrequent   Drug use: No   Sexual activity: Yes    Comment: Engineer, structural, [employer], married, 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 He has 1 child. His child still lives at home. He likes doing lawn work.    Social Drivers of Corporate investment banker Strain: Low Risk  (02/21/2024)   Overall Financial Resource Strain (CARDIA)    Difficulty of Paying Living Expenses: Not hard at all  Food Insecurity: No Food Insecurity (02/21/2024)   Hunger Vital Sign    Worried About Running Out of Food in the Last Year: Never true    Ran Out of Food in the  Last Year: Never true  Transportation Needs: No Transportation Needs (02/21/2024)   PRAPARE - Administrator, Civil Service (Medical): No    Lack of Transportation (Non-Medical): No  Physical Activity: Insufficiently Active (02/21/2024)   Exercise Vital Sign    Days of Exercise per Week: 4 days    Minutes of Exercise per Session: 20 min  Stress: No Stress Concern Present (02/21/2024)   [employer] of Occupational Health - Occupational Stress Questionnaire    Feeling of Stress : Not at all  Social Connections: Socially Integrated (02/21/2024)   Social Connection and Isolation Panel [NHANES]    Frequency of Communication with Friends and Family: More than three times a week    Frequency of Social Gatherings with Friends and Family: More than three times a week    Attends Religious Services: More than 4 times per year    Active Member of [employer] or Organizations: Not on file    Attends Engineer, structural: More than 4 times per year    Marital Status: Married    Tobacco Counseling Counseling given: Not Answered   Clinical Intake:  Pre-visit preparation completed: Yes  Pain : No/denies pain     BMI - recorded: 33.36 Nutritional Status: BMI > 30  Obese Nutritional Risks: None Diabetes: Yes CBG done?: No Did pt. bring in CBG monitor from home?: No  How often do you need to have someone help you when you read instructions, pamphlets, or other written materials from your doctor or pharmacy?: 1 - Never What is the last grade level you completed in school?: 16  Interpreter Needed?: No      Activities of Daily Living    02/21/2024    3:45 PM  In your present state of health, do you have any difficulty performing the following activities:  Hearing? 0  Vision? 0  Difficulty concentrating or making decisions? 0  Walking or climbing stairs? 0  Doing errands, shopping? 0  Preparing Food and eating ? N  Using the Toilet? N  In the past six months, have you  accidently leaked urine? N  Do you have problems with loss of bowel control? N  Managing your Medications? N  Managing your Finances? N  Housekeeping or managing your Housekeeping? N    Patient Care Team: Everrett Coombe, DO as PCP - General (Family Medicine) Raynald Blend, OD (Optometry) Timothy Lasso, MD as Physician Assistant (Urology)  Indicate any recent Medical Services you may have received from other than [facility] providers in the past year (date may be approximate).     Assessment:   This is a routine wellness examination for Jayzon.  Hearing/Vision screen  No results found.   Goals Addressed             This Visit's Progress    DIET - EAT MORE FRUITS AND VEGETABLES       He would like to grow vegetables.      Depression Screen    02/21/2024    3:56 PM 01/17/2024    9:05 AM 10/17/2023    2:00 PM 03/09/2023    3:48 PM 12/07/2021    3:37 PM 09/04/2021    9:58 AM 04/28/2020    1:48 PM  PHQ 2/9 Scores  PHQ - 2 Score 0 0 0 0 0 1 0  PHQ- 9 Score       3    Fall Risk    02/21/2024    3:58 PM 01/17/2024    9:05 AM 10/17/2023    2:00 PM 03/09/2023    3:48 PM 03/08/2022    9:51 AM  Fall Risk   Falls in the past year? 0 0 0 0 0  Number falls in past yr: 0 0 0 0 0  Injury with Fall? 0 0 0 0 0  Risk for fall due to : No Fall Risks No Fall Risks No Fall Risks No Fall Risks No Fall Risks  Follow up Falls evaluation completed Falls evaluation completed Falls evaluation completed Falls evaluation completed Falls evaluation completed    MEDICARE RISK AT HOME: Medicare Risk at Home Any stairs in or around the home?: Yes If so, are there any without handrails?: No Home free of loose throw rugs in walkways, pet beds, electrical cords, etc?: No Adequate lighting in your home to reduce risk of falls?: Yes Life alert?: No Use of a cane, walker or w/c?: No Grab bars in the bathroom?: No Shower chair or bench in shower?: No Elevated toilet seat or a handicapped toilet?:  No  TIMED UP AND GO:  Was the test performed?  Yes  Length of time to ambulate 10 feet: 8 sec Gait steady and fast without use of assistive device    Cognitive Function:        02/21/2024    4:00 PM  6CIT Screen  What Year? 0 points  What month? 0 points  What time? 0 points  Count back from 20 0 points  Months in reverse 0 points  Repeat phrase 0 points  Total Score 0 points    Immunizations Immunization History  Administered Date(s) Administered   Fluad Trivalent(High Dose 65+) 10/17/2023   Influenza Split 10/12/2011, 10/10/2012   Influenza Whole 10/12/2010   Influenza, Quadrivalent, Recombinant, Inj, Pf 10/19/2019   Influenza, Seasonal, Injecte, Preservative Fre 08/09/2013   Influenza,inj,Quad PF,6+ Mos 09/06/2014, 10/28/2015, 09/10/2016, 09/15/2017, 11/21/2018, 09/02/2020, 09/04/2021, 09/08/2022   Moderna SARS-COV2 Booster Vaccination 01/15/2022   Moderna Sars-Covid-2 Vaccination 03/21/2020, 04/18/2020, 01/02/2021   PNEUMOCOCCAL CONJUGATE-20 07/14/2023   Pfizer(Comirnaty)Fall Seasonal Vaccine 12 years and older 01/17/2024   Pneumococcal Polysaccharide-23 11/21/2018   Td 06/04/2008   Tdap 09/08/2022   Zoster Recombinant(Shingrix) 11/30/2018, 09/04/2021    TDAP status: Up to date  Flu Vaccine status: Up to date  Pneumococcal vaccine status: Up to date  Covid-19 vaccine status: Completed vaccines  Qualifies for Shingles Vaccine? Yes   Zostavax completed No   Shingrix Completed?: Yes  Screening Tests Health Maintenance  Topic Date Due   HEMOGLOBIN A1C  07/16/2024   Diabetic kidney evaluation - eGFR measurement  10/18/2024   Diabetic kidney evaluation - Urine ACR  10/18/2024   FOOT EXAM  01/16/2025   OPHTHALMOLOGY EXAM  02/14/2025   Medicare Annual Wellness (AWV)  02/20/2025   Fecal DNA (Cologuard)  11/29/2025   DTaP/Tdap/Td (3 - Td or Tdap) 09/08/2032   Pneumonia Vaccine 69+ Years old  Completed   INFLUENZA VACCINE  Completed   COVID-19 Vaccine   Completed   Hepatitis C Screening  Completed   Zoster Vaccines- Shingrix  Completed   HPV VACCINES  Aged Out   Colonoscopy  Discontinued    Health Maintenance  There are no preventive care reminders to display for this patient.   Colorectal cancer screening: Type of screening: Cologuard. Completed 11/29/2022. Repeat every 3 years  Lung Cancer Screening: (Low Dose CT Chest recommended if Age 41-80 years, 20 pack-year currently smoking OR have quit w/in 15years.) does not qualify.   Lung Cancer Screening Referral: n/a  Additional Screening:  Hepatitis C Screening: does qualify; Completed 09/10/2016  Vision Screening: Recommended annual ophthalmology exams for early detection of glaucoma and other disorders of the eye. Is the patient up to date with their annual eye exam?  Yes  Who is the provider or what is the name of the office in which the patient attends annual eye exams[facility]  If pt is not established with a provider, would they like to be referred to a provider to establish care?  N/a .   Dental Screening: Recommended annual dental exams for proper oral hygiene  Diabetic Foot Exam: Diabetic Foot Exam: Completed 01/17/2024  Community Resource Referral / Chronic Care Management: CRR required this visit?  No   CCM required this visit?  No     Plan:     I have personally reviewed and noted the following in the patient's chart:   Medical and social history Use of alcohol, tobacco or illicit drugs  Current medications and supplements including opioid prescriptions. Patient is not currently taking opioid prescriptions. Functional ability and status Nutritional status Physical activity Advanced directives List of other physicians Hospitalizations, surgeries, and ER visits in previous 12 months Vitals Screenings to include cognitive, depression, and falls Referrals and appointments  In addition, I have reviewed and discussed with patient certain  preventive protocols, quality metrics, and best practice recommendations. A written personalized care plan for preventive services as well as general preventive health recommendations were provided to patient.     Esmond Harps, CMA   02/21/2024   After Visit Summary: (In Person-Printed) AVS printed and given to the patient  Nurse Notes:

## 2024-03-14 ENCOUNTER — Other Ambulatory Visit: Payer: Self-pay | Admitting: Family Medicine

## 2024-03-14 DIAGNOSIS — E1169 Type 2 diabetes mellitus with other specified complication: Secondary | ICD-10-CM

## 2024-03-14 DIAGNOSIS — E119 Type 2 diabetes mellitus without complications: Secondary | ICD-10-CM

## 2024-03-21 ENCOUNTER — Other Ambulatory Visit: Payer: Self-pay | Admitting: Family Medicine

## 2024-05-16 ENCOUNTER — Encounter: Payer: Self-pay | Admitting: Family Medicine

## 2024-05-16 ENCOUNTER — Ambulatory Visit (INDEPENDENT_AMBULATORY_CARE_PROVIDER_SITE_OTHER): Payer: HMO | Admitting: Family Medicine

## 2024-05-16 ENCOUNTER — Telehealth: Payer: Self-pay

## 2024-05-16 VITALS — BP 103/67 | HR 71 | Ht 67.0 in | Wt 210.0 lb

## 2024-05-16 DIAGNOSIS — E1169 Type 2 diabetes mellitus with other specified complication: Secondary | ICD-10-CM

## 2024-05-16 DIAGNOSIS — E119 Type 2 diabetes mellitus without complications: Secondary | ICD-10-CM

## 2024-05-16 DIAGNOSIS — E039 Hypothyroidism, unspecified: Secondary | ICD-10-CM

## 2024-05-16 DIAGNOSIS — Z7984 Long term (current) use of oral hypoglycemic drugs: Secondary | ICD-10-CM

## 2024-05-16 DIAGNOSIS — E785 Hyperlipidemia, unspecified: Secondary | ICD-10-CM | POA: Diagnosis not present

## 2024-05-16 LAB — POCT GLYCOSYLATED HEMOGLOBIN (HGB A1C): HbA1c, POC (controlled diabetic range): 8.5 % — AB (ref 0.0–7.0)

## 2024-05-16 MED ORDER — TIRZEPATIDE 2.5 MG/0.5ML ~~LOC~~ SOAJ: 2.5000 mg | SUBCUTANEOUS | 0 refills | Status: DC

## 2024-05-16 MED ORDER — TIRZEPATIDE 7.5 MG/0.5ML ~~LOC~~ SOAJ: 7.5000 mg | SUBCUTANEOUS | 0 refills | Status: AC

## 2024-05-16 MED ORDER — TIRZEPATIDE 5 MG/0.5ML ~~LOC~~ SOAJ: 5.0000 mg | SUBCUTANEOUS | 0 refills | Status: AC

## 2024-05-16 NOTE — Telephone Encounter (Signed)
Please start PA for [facility]  Thank you

## 2024-05-16 NOTE — Assessment & Plan Note (Signed)
 Blood sugars are still elevated today.  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  Continue all other medications at current strength.  F/u in 4 months.

## 2024-05-16 NOTE — Assessment & Plan Note (Signed)
 Continue atorvastatin at current strength.

## 2024-05-16 NOTE — Progress Notes (Signed)
 Paul Reed - 68 y.o. male MRN [id number]  Date of birth: February 27, 1956  Subjective Chief Complaint  Patient presents with   Diabetes    HPI Paul Reed is a 68 y.o. male here today for follow up visit.   He reports that he is doing pretty well.   He is doing well with combination of jardiance , glipizide , metformin  for management of diabetes.  He never received Mounjaro due to this needing a PA.   He is tolerating medications well at current strength.  Denies symptoms of hypoglycemia.    BP is well controlled at this time.  Denies side effects with current medication.  Has not had chest pain, shortness of breath, palpitations, headache or vision changes.   Seen by urology due to elevated PSA.  No biopsy needed at this time based on MRI and stable PSA levels.   ROS:  A comprehensive ROS was completed and negative except as noted per HPI  Allergies  Allergen Reactions   Bee Venom Hives    Bee Stings   Other Hives    Bee Stings    Past Medical History:  Diagnosis Date   Bursitis    Chronic kidney disease    stones   ELEVATED PROSTATE SPECIFIC ANTIGEN 10/23/2008   Biopsy planned with Dr. Domingo Friend ([facility])     Hypothyroidism    Impaired fasting glucose    Obesity    OSA on CPAP    13 years    Past Surgical History:  Procedure Laterality Date   ESOPHAGUS SURGERY     Removed glass in infancy    Social History   Socioeconomic History   Marital status: Married    Spouse name: Not on file   Number of children: Not on file   Years of education: Not on file   Highest education level: Not on file  Occupational History   Not on file  Tobacco Use   Smoking status: Never   Smokeless tobacco: Never  Substance and Sexual Activity   Alcohol use: Yes    Comment: very infrequent   Drug use: No   Sexual activity: Yes    Comment: Engineer, structural, [employer], married, 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 He has 1 child. His child still lives at home. He likes doing lawn work.    Social Drivers of Corporate investment banker Strain: Low Risk  (02/21/2024)   Overall Financial Resource Strain (CARDIA)    Difficulty of Paying Living Expenses: Not hard at all  Food Insecurity: No Food Insecurity (02/21/2024)   Hunger Vital Sign    Worried About Running Out of Food in the Last Year: Never true    Ran Out of Food in the Last Year: Never true  Transportation Needs: No Transportation Needs (02/21/2024)   PRAPARE - Administrator, Civil Service (Medical): No    Lack of Transportation (Non-Medical): No  Physical Activity: Insufficiently Active (02/21/2024)   Exercise Vital Sign    Days of Exercise per Week: 4 days    Minutes of Exercise per Session: 20 min  Stress: No Stress Concern Present (02/21/2024)   [employer] of Occupational Health - Occupational Stress Questionnaire    Feeling of Stress : Not at all  Social Connections: Socially Integrated (02/21/2024)   Social Connection and Isolation Panel [NHANES]  Frequency of Communication with Friends and Family: More than three times a week    Frequency of Social Gatherings with Friends and Family: More than three times a week    Attends Religious Services: More than 4 times per year    Active Member of Clubs or Organizations: Not on file    Attends Engineer, structural: More than 4 times per year    Marital Status: Married    Family History  Problem Relation Age of Onset   Cancer Father    Thyroid  disease Other        mother    Health Maintenance  Topic Date Due   COVID-19 Vaccine (6 - 2024-25 season) 07/16/2024   INFLUENZA VACCINE  07/20/2024   Diabetic kidney evaluation - eGFR measurement  10/18/2024   Diabetic kidney evaluation - Urine ACR  10/18/2024   HEMOGLOBIN A1C  11/16/2024   FOOT EXAM  01/16/2025   OPHTHALMOLOGY EXAM  02/14/2025    Medicare Annual Wellness (AWV)  02/20/2025   Fecal DNA (Cologuard)  11/29/2025   DTaP/Tdap/Td (3 - Td or Tdap) 09/08/2032   Pneumonia Vaccine 46+ Years old  Completed   Hepatitis C Screening  Completed   Zoster Vaccines- Shingrix   Completed   HPV VACCINES  Aged Out   Meningococcal B Vaccine  Aged Out   Colonoscopy  Discontinued     ----------------------------------------------------------------------------------------------------------------------------------------------------------------------------------------------------------------- Physical Exam BP 103/67 (BP Location: Left Arm, Patient Position: Sitting, Cuff Size: Normal)   Pulse 71   Ht 5\' 7"  (1.702 m)   Wt 210 lb (95.3 kg)   SpO2 98%   BMI 32.89 kg/m   Physical Exam Constitutional:      Appearance: Normal appearance.  Eyes:     General: No scleral icterus. Cardiovascular:     Rate and Rhythm: Normal rate and regular rhythm.  Pulmonary:     Effort: Pulmonary effort is normal.     Breath sounds: Normal breath sounds.  Musculoskeletal:     Cervical back: Neck supple.  Neurological:     Mental Status: He is alert.  Psychiatric:        Mood and Affect: Mood normal.        Behavior: Behavior normal.     ------------------------------------------------------------------------------------------------------------------------------------------------------------------------------------------------------------------- Assessment and Plan  Type 2 diabetes mellitus (HCC) Blood sugars are still elevated today.  Will work on getting authorization for mounjaro .  Continue all other medications at current strength.  F/u in 4 months.   Hypothyroidism Doing well with current strength of levothyroxine .    Hyperlipidemia associated with type 2 diabetes mellitus (HCC) Continue atorvastatin  at current strength.    No orders of the defined types were placed in this encounter.   No follow-ups on file.

## 2024-05-16 NOTE — Assessment & Plan Note (Signed)
Doing well with current strength of levothyroxine.

## 2024-05-17 ENCOUNTER — Telehealth: Payer: Self-pay

## 2024-05-17 ENCOUNTER — Other Ambulatory Visit (HOSPITAL_COMMUNITY): Payer: Self-pay

## 2024-05-17 NOTE — Telephone Encounter (Signed)
 Pharmacy Patient Advocate Encounter  Received notification from [facility] [employer] that Prior Authorization for Mounjaro 2.5mg /0.27ml has been APPROVED from 05/17/24 to 05/17/25. Ran test claim, Copay is $47.00. This test claim was processed through [facility]- copay amounts may vary at other pharmacies due to pharmacy/plan contracts, or as the patient moves through the different stages of their insurance plan.   Called and left a message at [facility] to notify of the approval.

## 2024-05-17 NOTE — Telephone Encounter (Signed)
 Pharmacy Patient Advocate Encounter   Received notification from Pt Calls Messages that prior authorization for Mounjaro 2.5mg /0.18ml is required/requested.   Insurance verification completed.   The patient is insured through [facility] ADVANTAGE/RX ADVANCE .   Per test claim: PA required; PA submitted to above mentioned insurance via CoverMyMeds Key/confirmation #/[id number] Status is pending

## 2024-05-21 ENCOUNTER — Telehealth: Payer: Self-pay

## 2024-05-21 NOTE — Telephone Encounter (Signed)
 Patient scheduled for nurse visit tomorrow 05/22/24 for instructions on injection Mounjaro .

## 2024-05-21 NOTE — Telephone Encounter (Signed)
 Copied from CRM [id number]. Topic: Clinical - Medical Advice >> May 21, 2024  3:01 PM Dominique A wrote: Reason for CRM: Patient states that he just picked up his Mounjaro  from the pharmacy and want to see if he can come to the office and someone show him how to inject the medication into him. Please call patient back.

## 2024-05-22 ENCOUNTER — Ambulatory Visit (INDEPENDENT_AMBULATORY_CARE_PROVIDER_SITE_OTHER)

## 2024-05-22 VITALS — BP 103/67 | Ht 67.0 in | Wt 210.0 lb

## 2024-05-22 DIAGNOSIS — Z7984 Long term (current) use of oral hypoglycemic drugs: Secondary | ICD-10-CM | POA: Diagnosis not present

## 2024-05-22 DIAGNOSIS — E1122 Type 2 diabetes mellitus with diabetic chronic kidney disease: Secondary | ICD-10-CM

## 2024-05-22 DIAGNOSIS — N181 Chronic kidney disease, stage 1: Secondary | ICD-10-CM | POA: Diagnosis not present

## 2024-05-22 NOTE — Progress Notes (Unsigned)
 Pt presented for Mounjaro  injectable training. Provided patient with alcohol wipes and written instructions with pictures for reference.   After demonstrating injection procedure to the patient step by step; pt followed procedure and took 1st dose of medication while in the office.   Pt states that was easy and he hardly felt the injection. Agrees to treatment plan.

## 2024-06-14 ENCOUNTER — Other Ambulatory Visit: Payer: Self-pay | Admitting: Family Medicine

## 2024-06-14 DIAGNOSIS — E039 Hypothyroidism, unspecified: Secondary | ICD-10-CM

## 2024-06-30 ENCOUNTER — Other Ambulatory Visit: Payer: Self-pay | Admitting: Family Medicine

## 2024-06-30 DIAGNOSIS — E119 Type 2 diabetes mellitus without complications: Secondary | ICD-10-CM

## 2024-08-08 ENCOUNTER — Other Ambulatory Visit: Payer: Self-pay | Admitting: Family Medicine

## 2024-08-16 ENCOUNTER — Other Ambulatory Visit (HOSPITAL_BASED_OUTPATIENT_CLINIC_OR_DEPARTMENT_OTHER): Payer: Self-pay

## 2024-08-16 MED FILL — Tirzepatide Soln Auto-injector 2.5 MG/0.5ML: SUBCUTANEOUS | 84 days supply | Qty: 6 | Fill #0 | Status: AC

## 2024-08-17 ENCOUNTER — Other Ambulatory Visit (HOSPITAL_BASED_OUTPATIENT_CLINIC_OR_DEPARTMENT_OTHER): Payer: Self-pay

## 2024-09-05 ENCOUNTER — Other Ambulatory Visit: Payer: Self-pay | Admitting: Family Medicine

## 2024-09-05 DIAGNOSIS — E1169 Type 2 diabetes mellitus with other specified complication: Secondary | ICD-10-CM

## 2024-09-05 DIAGNOSIS — E119 Type 2 diabetes mellitus without complications: Secondary | ICD-10-CM

## 2024-09-17 ENCOUNTER — Ambulatory Visit: Admitting: Family Medicine

## 2024-09-17 VITALS — BP 107/66 | HR 77 | Ht 67.0 in | Wt 200.0 lb

## 2024-09-17 DIAGNOSIS — R748 Abnormal levels of other serum enzymes: Secondary | ICD-10-CM

## 2024-09-17 DIAGNOSIS — I10 Essential (primary) hypertension: Secondary | ICD-10-CM | POA: Insufficient documentation

## 2024-09-17 DIAGNOSIS — Z23 Encounter for immunization: Secondary | ICD-10-CM

## 2024-09-17 DIAGNOSIS — N181 Chronic kidney disease, stage 1: Secondary | ICD-10-CM

## 2024-09-17 DIAGNOSIS — E1122 Type 2 diabetes mellitus with diabetic chronic kidney disease: Secondary | ICD-10-CM | POA: Diagnosis not present

## 2024-09-17 DIAGNOSIS — Z7984 Long term (current) use of oral hypoglycemic drugs: Secondary | ICD-10-CM

## 2024-09-17 DIAGNOSIS — E1169 Type 2 diabetes mellitus with other specified complication: Secondary | ICD-10-CM | POA: Diagnosis not present

## 2024-09-17 DIAGNOSIS — E781 Pure hyperglyceridemia: Secondary | ICD-10-CM

## 2024-09-17 DIAGNOSIS — E559 Vitamin D deficiency, unspecified: Secondary | ICD-10-CM

## 2024-09-17 DIAGNOSIS — E039 Hypothyroidism, unspecified: Secondary | ICD-10-CM | POA: Diagnosis not present

## 2024-09-17 DIAGNOSIS — E785 Hyperlipidemia, unspecified: Secondary | ICD-10-CM

## 2024-09-17 LAB — POCT GLYCOSYLATED HEMOGLOBIN (HGB A1C): HbA1c, POC (controlled diabetic range): 6.8 % (ref 0.0–7.0)

## 2024-09-17 NOTE — Assessment & Plan Note (Signed)
 Blood sugars have improved quite a bit.  Continue mounjaro , titrate to 5mg /week.  Continue dietary changes.

## 2024-09-17 NOTE — Assessment & Plan Note (Signed)
 Continue atorvastatin .  Update lipids.

## 2024-09-17 NOTE — Assessment & Plan Note (Signed)
Doing well with current strength of levothyroxine.  Update TSH

## 2024-09-17 NOTE — Assessment & Plan Note (Signed)
 BP is well controlled.  Continue current medications at current strength.

## 2024-09-17 NOTE — Progress Notes (Signed)
 Paul Reed - 68 y.o. male MRN [id number]  Date of birth: 18-Nov-1956  Subjective Chief Complaint  Patient presents with   Diabetes    HPI Paul Reed is a 68 y.o. male here today for follow up visit.   He reports that he is doing well.   He continues on mounjaro , metformin  jardiance  and glipizide .  He is doing pretty well with mounjaro .  A1c today is 6.8%.  He has not increased mounjaro  from 2.5mg  strength.  He denies hypoglycemia.  Weight is stable.   He remains on hydrochlorothiazide  for BP management and is doing well with this.  BP today is well controlled.  Denies chest pain, shortness of breath, palpitations, headache or vision changes.    Allergies  Allergen Reactions   Bee Venom Hives    Bee Stings   Other Hives    Bee Stings    Past Medical History:  Diagnosis Date   Bursitis    Chronic kidney disease    stones   ELEVATED PROSTATE SPECIFIC ANTIGEN 10/23/2008   Biopsy planned with Dr. Chauncey ([facility])     Hypothyroidism    Impaired fasting glucose    Obesity    OSA on CPAP    13 years    Past Surgical History:  Procedure Laterality Date   ESOPHAGUS SURGERY     Removed glass in infancy    Social History   Socioeconomic History   Marital status: Married    Spouse name: Not on file   Number of children: Not on file   Years of education: Not on file   Highest education level: Bachelor's degree (e.g., BA, AB, BS)  Occupational History   Not on file  Tobacco Use   Smoking status: Never   Smokeless tobacco: Never  Substance and Sexual Activity   Alcohol use: Yes    Comment: very infrequent   Drug use: No   Sexual activity: Yes    Comment: Engineer, structural, [employer], married, 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 He has 1 child. His child still lives at home. He likes doing lawn work.    Social Drivers of Manufacturing engineer Strain: Low Risk  (09/17/2024)   Overall Financial Resource Strain (CARDIA)    Difficulty of Paying Living Expenses: Not very hard  Food Insecurity: No Food Insecurity (09/17/2024)   Hunger Vital Sign    Worried About Running Out of Food in the Last Year: Never true    Ran Out of Food in the Last Year: Never true  Transportation Needs: No Transportation Needs (09/17/2024)   PRAPARE - Administrator, Civil Service (Medical): No    Lack of Transportation (Non-Medical): No  Physical Activity: Insufficiently Active (09/17/2024)   Exercise Vital Sign    Days of Exercise per Week: 4 days    Minutes of Exercise per Session: 30 min  Stress: No Stress Concern Present (09/17/2024)   [employer] of Occupational Health - Occupational Stress Questionnaire    Feeling of Stress: Not at all  Social Connections: Moderately Integrated (09/17/2024)   Social Connection and Isolation Panel    Frequency of Communication with Friends and Family: Once a week    Frequency of Social Gatherings with Friends and Family: Once a week    Attends Religious Services: More than 4 times per year  Active Member of Clubs or Organizations: Yes    Attends Banker Meetings: More than 4 times per year    Marital Status: Married    Family History  Problem Relation Age of Onset   Cancer Father    Thyroid  disease Other        mother    Health Maintenance  Topic Date Due   COVID-19 Vaccine (6 - 2024-25 season) 10/03/2025 (Originally 08/20/2024)   Diabetic kidney evaluation - eGFR measurement  10/18/2024   Diabetic kidney evaluation - Urine ACR  10/18/2024   FOOT EXAM  01/16/2025   OPHTHALMOLOGY EXAM  02/14/2025   Medicare Annual Wellness (AWV)  02/20/2025   HEMOGLOBIN A1C  03/17/2025   Fecal DNA (Cologuard)  11/29/2025   DTaP/Tdap/Td (3 - Td or Tdap) 09/08/2032   Pneumococcal Vaccine: 50+ Years  Completed   Influenza Vaccine  Completed   Hepatitis C Screening   Completed   Zoster Vaccines- Shingrix   Completed   HPV VACCINES  Aged Out   Meningococcal B Vaccine  Aged Out   Colonoscopy  Discontinued     ----------------------------------------------------------------------------------------------------------------------------------------------------------------------------------------------------------------- Physical Exam BP 107/66 (BP Location: Left Arm, Patient Position: Sitting, Cuff Size: Normal)   Pulse 77   Ht 5' 7 (1.702 m)   Wt 200 lb (90.7 kg)   SpO2 96%   BMI 31.32 kg/m   Physical Exam Constitutional:      Appearance: Normal appearance.  Eyes:     General: No scleral icterus. Cardiovascular:     Rate and Rhythm: Normal rate and regular rhythm.  Pulmonary:     Effort: Pulmonary effort is normal.     Breath sounds: Normal breath sounds.  Musculoskeletal:     Cervical back: Neck supple.  Neurological:     General: No focal deficit present.     Mental Status: He is alert.  Psychiatric:        Mood and Affect: Mood normal.        Behavior: Behavior normal.     ------------------------------------------------------------------------------------------------------------------------------------------------------------------------------------------------------------------- Assessment and Plan  Type 2 diabetes mellitus (HCC) Blood sugars have improved quite a bit.  Continue mounjaro , titrate to 5mg /week.  Continue dietary changes.   Hypothyroidism Doing well with current strength of levothyroxine .  Update TSH  Hypertriglyceridemia Continue atorvastatin .  Update lipids.   Essential hypertension BP is well controlled.  Continue current medications at current strength.    No orders of the defined types were placed in this encounter.   Return in about 6 months (around 03/17/2025) for Type 2 Diabetes, Hypertension.

## 2024-10-30 ENCOUNTER — Other Ambulatory Visit: Payer: Self-pay | Admitting: Family Medicine

## 2024-10-31 ENCOUNTER — Other Ambulatory Visit: Payer: Self-pay | Admitting: Family Medicine

## 2024-10-31 DIAGNOSIS — E119 Type 2 diabetes mellitus without complications: Secondary | ICD-10-CM

## 2024-11-06 ENCOUNTER — Other Ambulatory Visit: Payer: Self-pay | Admitting: Family Medicine

## 2024-11-06 ENCOUNTER — Other Ambulatory Visit (HOSPITAL_BASED_OUTPATIENT_CLINIC_OR_DEPARTMENT_OTHER): Payer: Self-pay

## 2024-11-06 DIAGNOSIS — E1122 Type 2 diabetes mellitus with diabetic chronic kidney disease: Secondary | ICD-10-CM

## 2024-11-06 MED ORDER — MOUNJARO 2.5 MG/0.5ML ~~LOC~~ SOAJ
2.5000 mg | SUBCUTANEOUS | 0 refills | Status: AC
  Filled 2024-11-06: qty 6, 84d supply, fill #0

## 2024-12-05 ENCOUNTER — Other Ambulatory Visit: Payer: Self-pay | Admitting: Family Medicine

## 2024-12-05 DIAGNOSIS — E039 Hypothyroidism, unspecified: Secondary | ICD-10-CM

## 2024-12-19 ENCOUNTER — Other Ambulatory Visit: Payer: Self-pay | Admitting: Family Medicine

## 2024-12-19 DIAGNOSIS — E119 Type 2 diabetes mellitus without complications: Secondary | ICD-10-CM

## 2025-02-26 ENCOUNTER — Ambulatory Visit

## 2025-03-18 ENCOUNTER — Ambulatory Visit: Admitting: Family Medicine
